# Patient Record
Sex: Female | Born: 1980 | Race: White | Hispanic: No | Marital: Married | Smoking: Former smoker
Health system: Southern US, Community
[De-identification: ages and names within clinical notes are randomized; demographics above are authoritative.]

## PROBLEM LIST (undated history)

## (undated) DIAGNOSIS — T8859XA Other complications of anesthesia, initial encounter: Secondary | ICD-10-CM

## (undated) DIAGNOSIS — S82892A Other fracture of left lower leg, initial encounter for closed fracture: Secondary | ICD-10-CM

## (undated) DIAGNOSIS — L503 Dermatographic urticaria: Secondary | ICD-10-CM

## (undated) DIAGNOSIS — E785 Hyperlipidemia, unspecified: Secondary | ICD-10-CM

## (undated) DIAGNOSIS — R112 Nausea with vomiting, unspecified: Secondary | ICD-10-CM

## (undated) DIAGNOSIS — I10 Essential (primary) hypertension: Secondary | ICD-10-CM

## (undated) DIAGNOSIS — T4145XA Adverse effect of unspecified anesthetic, initial encounter: Secondary | ICD-10-CM

## (undated) DIAGNOSIS — Z9889 Other specified postprocedural states: Secondary | ICD-10-CM

## (undated) HISTORY — PX: CERVICAL CONE BIOPSY: SUR198

## (undated) HISTORY — DX: Dermatographic urticaria: L50.3

## (undated) HISTORY — PX: WISDOM TOOTH EXTRACTION: SHX21

---

## 2013-11-28 ENCOUNTER — Ambulatory Visit (INDEPENDENT_AMBULATORY_CARE_PROVIDER_SITE_OTHER): Payer: BC Managed Care – PPO | Admitting: General Practice

## 2013-11-28 ENCOUNTER — Encounter: Payer: Self-pay | Admitting: General Practice

## 2013-11-28 ENCOUNTER — Encounter (INDEPENDENT_AMBULATORY_CARE_PROVIDER_SITE_OTHER): Payer: Self-pay

## 2013-11-28 ENCOUNTER — Telehealth: Payer: Self-pay | Admitting: Nurse Practitioner

## 2013-11-28 VITALS — BP 148/96 | HR 115 | Temp 101.7°F | Wt 218.0 lb

## 2013-11-28 DIAGNOSIS — N390 Urinary tract infection, site not specified: Secondary | ICD-10-CM

## 2013-11-28 DIAGNOSIS — J029 Acute pharyngitis, unspecified: Secondary | ICD-10-CM

## 2013-11-28 DIAGNOSIS — R509 Fever, unspecified: Secondary | ICD-10-CM

## 2013-11-28 LAB — POCT URINALYSIS DIPSTICK
Bilirubin, UA: NEGATIVE
Glucose, UA: NEGATIVE
Ketones, UA: NEGATIVE
Nitrite, UA: NEGATIVE
pH, UA: 6

## 2013-11-28 LAB — POCT INFLUENZA A/B: Influenza B, POC: NEGATIVE

## 2013-11-28 LAB — POCT RAPID STREP A (OFFICE): Rapid Strep A Screen: NEGATIVE

## 2013-11-28 MED ORDER — CEPHALEXIN 500 MG PO CAPS: 500.0000 mg | ORAL_CAPSULE | Freq: Two times a day (BID) | ORAL | Status: DC

## 2013-11-28 NOTE — Telephone Encounter (Signed)
appt today at 4:10 with Kylie Cook

## 2013-11-28 NOTE — Patient Instructions (Addendum)
Fever, Adult °A fever is a higher than normal body temperature. In an adult, an oral temperature around 98.6° F (37° C) is considered normal. A temperature of 100.4° F (38° C) or higher is generally considered a fever. Mild or moderate fevers generally have no long-term effects and often do not require treatment. Extreme fever (greater than or equal to 106° F or 41.1° C) can cause seizures. The sweating that may occur with repeated or prolonged fever may cause dehydration. Elderly people can develop confusion during a fever. °A measured temperature can vary with: °· Age. °· Time of day. °· Method of measurement (mouth, underarm, rectal, or ear). °The fever is confirmed by taking a temperature with a thermometer. Temperatures can be taken different ways. Some methods are accurate and some are not. °· An oral temperature is used most commonly. Electronic thermometers are fast and accurate. °· An ear temperature will only be accurate if the thermometer is positioned as recommended by the manufacturer. °· A rectal temperature is accurate and done for those adults who have a condition where an oral temperature cannot be taken. °· An underarm (axillary) temperature is not accurate and not recommended. °Fever is a symptom, not a disease.  °CAUSES  °· Infections commonly cause fever. °· Some noninfectious causes for fever include: °· Some arthritis conditions. °· Some thyroid or adrenal gland conditions. °· Some immune system conditions. °· Some types of cancer. °· A medicine reaction. °· High doses of certain street drugs such as methamphetamine. °· Dehydration. °· Exposure to high outside or room temperatures. °· Occasionally, the source of a fever cannot be determined. This is sometimes called a "fever of unknown origin" (FUO). °· Some situations may lead to a temporary rise in body temperature that may go away on its own. Examples are: °· Childbirth. °· Surgery. °· Intense exercise. °HOME CARE INSTRUCTIONS  °· Take  appropriate medicines for fever. Follow dosing instructions carefully. If you use acetaminophen to reduce the fever, be careful to avoid taking other medicines that also contain acetaminophen. Do not take aspirin for a fever if you are younger than age 19. There is an association with Reye's syndrome. Reye's syndrome is a rare but potentially deadly disease. °· If an infection is present and antibiotics have been prescribed, take them as directed. Finish them even if you start to feel better. °· Rest as needed. °· Maintain an adequate fluid intake. To prevent dehydration during an illness with prolonged or recurrent fever, you may need to drink extra fluid. Drink enough fluids to keep your urine clear or pale yellow. °· Sponging or bathing with room temperature water may help reduce body temperature. Do not use ice water or alcohol sponge baths. °· Dress comfortably, but do not over-bundle. °SEEK MEDICAL CARE IF:  °· You are unable to keep fluids down. °· You develop vomiting or diarrhea. °· You are not feeling at least partly better after 3 days. °· You develop new symptoms or problems. °SEEK IMMEDIATE MEDICAL CARE IF:  °· You have shortness of breath or trouble breathing. °· You develop excessive weakness. °· You are dizzy or you faint. °· You are extremely thirsty or you are making little or no urine. °· You develop new pain that was not there before (such as in the head, neck, chest, back, or abdomen). °· You have persistant vomiting and diarrhea for more than 1 to 2 days. °· You develop a stiff neck or your eyes become sensitive to light. °· You develop a   skin rash. °· You have a fever or persistent symptoms for more than 2 to 3 days. °· You have a fever and your symptoms suddenly get worse. °MAKE SURE YOU:  °· Understand these instructions. °· Will watch your condition. °· Will get help right away if you are not doing well or get worse. °Document Released: 05/13/2001 Document Revised: 02/09/2012 Document  Reviewed: 09/18/2011 °ExitCare® Patient Information ©2014 [employer]. ° °Urinary Tract Infection °Urinary tract infections (UTIs) can develop anywhere along your urinary tract. Your urinary tract is your body's drainage system for removing wastes and extra water. Your urinary tract includes two kidneys, two ureters, a bladder, and a urethra. Your kidneys are a pair of bean-shaped organs. Each kidney is about the size of your fist. They are located below your ribs, one on each side of your spine. °CAUSES °Infections are caused by microbes, which are microscopic organisms, including fungi, viruses, and bacteria. These organisms are so small that they can only be seen through a microscope. Bacteria are the microbes that most commonly cause UTIs. °SYMPTOMS  °Symptoms of UTIs may vary by age and gender of the patient and by the location of the infection. Symptoms in young women typically include a frequent and intense urge to urinate and a painful, burning feeling in the bladder or urethra during urination. Older women and men are more likely to be tired, shaky, and weak and have muscle aches and abdominal pain. A fever may mean the infection is in your kidneys. Other symptoms of a kidney infection include pain in your back or sides below the ribs, nausea, and vomiting. °DIAGNOSIS °To diagnose a UTI, your caregiver will ask you about your symptoms. Your caregiver also will ask to provide a urine sample. The urine sample will be tested for bacteria and white blood cells. White blood cells are made by your body to help fight infection. °TREATMENT  °Typically, UTIs can be treated with medication. Because most UTIs are caused by a bacterial infection, they usually can be treated with the use of antibiotics. The choice of antibiotic and length of treatment depend on your symptoms and the type of bacteria causing your infection. °HOME CARE INSTRUCTIONS °· If you were prescribed antibiotics, take them exactly as your  caregiver instructs you. Finish the medication even if you feel better after you have only taken some of the medication. °· Drink enough water and fluids to keep your urine clear or pale yellow. °· Avoid caffeine, tea, and carbonated beverages. They tend to irritate your bladder. °· Empty your bladder often. Avoid holding urine for long periods of time. °· Empty your bladder before and after sexual intercourse. °· After a bowel movement, women should cleanse from front to back. Use each tissue only once. °SEEK MEDICAL CARE IF:  °· You have back pain. °· You develop a fever. °· Your symptoms do not begin to resolve within 3 days. °SEEK IMMEDIATE MEDICAL CARE IF:  °· You have severe back pain or lower abdominal pain. °· You develop chills. °· You have nausea or vomiting. °· You have continued burning or discomfort with urination. °MAKE SURE YOU:  °· Understand these instructions. °· Will watch your condition. °· Will get help right away if you are not doing well or get worse. °Document Released: 08/27/2005 Document Revised: 05/18/2012 Document Reviewed: 12/26/2011 °ExitCare® Patient Information ©2014 [employer]. ° °

## 2013-11-29 LAB — CBC WITH DIFFERENTIAL
Basophils Absolute: 0 10*3/uL (ref 0.0–0.2)
Basos: 1 %
Eos: 1 %
HCT: 42 % (ref 34.0–46.6)
Lymphocytes Absolute: 1.7 10*3/uL (ref 0.7–3.1)
Lymphs: 41 %
MCH: 29.3 pg (ref 26.6–33.0)
MCV: 85 fL (ref 79–97)
Monocytes: 13 %
Neutrophils Absolute: 1.8 10*3/uL (ref 1.4–7.0)
Platelets: 311 10*3/uL (ref 150–379)
RBC: 4.92 x10E6/uL (ref 3.77–5.28)
WBC: 4.2 10*3/uL (ref 3.4–10.8)

## 2013-11-29 LAB — MONONUCLEOSIS SCREEN: Mono Screen: NEGATIVE

## 2013-11-30 LAB — URINE CULTURE

## 2013-11-30 NOTE — Progress Notes (Signed)
Subjective:    Patient ID: Kylie Cook, female    DOB: 03/03/1981, 32 y.o.   MRN: [id number]  Fever  This is a new problem. The current episode started in the past 7 days. The problem occurs daily. The problem has been unchanged. The maximum temperature noted was 100 to 100.9 F. The temperature was taken using an oral thermometer. Associated symptoms include coughing, a rash and a sore throat. Pertinent negatives include no abdominal pain, chest pain, congestion, diarrhea or headaches. She has tried acetaminophen for the symptoms.  Sore Throat  Associated symptoms include coughing. Pertinent negatives include no abdominal pain, congestion, diarrhea, headaches or shortness of breath.  Cough Associated symptoms include a fever, a rash and a sore throat. Pertinent negatives include no chest pain, headaches, postnasal drip, rhinorrhea or shortness of breath.  Rash Associated symptoms include coughing, a fever and a sore throat. Pertinent negatives include no congestion, diarrhea, rhinorrhea or shortness of breath.      Review of Systems  Constitutional: Positive for fever.  HENT: Positive for sore throat. Negative for congestion, postnasal drip, rhinorrhea and sinus pressure.   Respiratory: Positive for cough. Negative for chest tightness and shortness of breath.   Cardiovascular: Negative for chest pain and palpitations.  Gastrointestinal: Negative for abdominal pain, diarrhea and abdominal distention.  Genitourinary: Negative for difficulty urinating.  Skin: Positive for rash.  Neurological: Negative for dizziness, weakness and headaches.       Objective:   Physical Exam  Constitutional: She is oriented to person, place, and time. She appears well-developed and well-nourished.  HENT:  Head: Normocephalic and atraumatic.  Right Ear: External ear normal.  Left Ear: External ear normal.  Mouth/Throat: Oropharynx is clear and moist.  Cardiovascular: Normal heart sounds.  Tachycardia  present.   Pulmonary/Chest: Effort normal and breath sounds normal. No respiratory distress. She exhibits no tenderness.  Neurological: She is alert and oriented to person, place, and time.  Skin: Skin is warm and dry.  Psychiatric: She has a normal mood and affect.   Results for orders placed in visit on 11/28/13  MONONUCLEOSIS SCREEN      Result Value Range   Mono Screen Negative  Negative  CBC WITH DIFFERENTIAL/PLATELET      Result Value Range   WBC 4.2  3.4 - 10.8 x10E3/uL   RBC 4.92  3.77 - 5.28 x10E6/uL   Hemoglobin 14.4  11.1 - 15.9 g/dL   HCT 40.9  81.1 - 91.4 %   MCV 85  79 - 97 fL   MCH 29.3  26.6 - 33.0 pg   MCHC 34.3  31.5 - 35.7 g/dL   RDW 78.2  95.6 - 21.3 %   Platelets 311  150 - 379 x10E3/uL   Neutrophils Relative % 44     Lymphs 41     Monocytes 13     Eos 1     Basos 1     Neutrophils Absolute 1.8  1.4 - 7.0 x10E3/uL   Lymphocytes Absolute 1.7  0.7 - 3.1 x10E3/uL   Monocytes Absolute 0.6  0.1 - 0.9 x10E3/uL   Eosinophils Absolute 0.1  0.0 - 0.4 x10E3/uL   Basophils Absolute 0.0  0.0 - 0.2 x10E3/uL   Immature Granulocytes 0     Immature Grans (Abs) 0.0  0.0 - 0.1 x10E3/uL  POCT RAPID STREP A (OFFICE)      Result Value Range   Rapid Strep A Screen Negative  Negative  POCT INFLUENZA A/B  Result Value Range   Influenza A, POC Negative     Influenza B, POC Negative    POCT URINALYSIS DIPSTICK      Result Value Range   Color, UA yellow     Clarity, UA clear     Glucose, UA neg     Bilirubin, UA neg     Ketones, UA neg     Spec Grav, UA 1.010     Blood, UA large     pH, UA 6.0     Protein, UA neg     Urobilinogen, UA negative     Nitrite, UA neg     Leukocytes, UA large (3+)            Assessment & Plan:  1. Sore throat  - POCT rapid strep A - POCT Influenza A/B - Mononucleosis screen  2. Fever  - CBC with Differential - POCT urinalysis dipstick - Mononucleosis screen  3. Urinary tract infection, site not specified  - Urine  culture - cephALEXin (KEFLEX) 500 MG capsule; Take 1 capsule (500 mg total) by mouth 2 (two) times daily.  Dispense: 20 capsule; Refill: 0 -Increase fluid intake Frequent voiding Proper perineal hygiene RTO prn Patient verbalized understanding Coralie Keens, FNP-C

## 2013-12-02 ENCOUNTER — Other Ambulatory Visit: Payer: Self-pay | Admitting: General Practice

## 2016-10-21 ENCOUNTER — Encounter: Payer: Self-pay | Admitting: Pediatrics

## 2016-10-21 ENCOUNTER — Ambulatory Visit (INDEPENDENT_AMBULATORY_CARE_PROVIDER_SITE_OTHER): Payer: BC Managed Care – PPO | Admitting: Pediatrics

## 2016-10-21 VITALS — BP 134/80 | HR 71 | Temp 98.5°F | Ht 67.5 in | Wt 213.4 lb

## 2016-10-21 DIAGNOSIS — J019 Acute sinusitis, unspecified: Secondary | ICD-10-CM | POA: Diagnosis not present

## 2016-10-21 DIAGNOSIS — J069 Acute upper respiratory infection, unspecified: Secondary | ICD-10-CM | POA: Diagnosis not present

## 2016-10-21 MED ORDER — AMOXICILLIN-POT CLAVULANATE 875-125 MG PO TABS: 1.0000 | ORAL_TABLET | Freq: Two times a day (BID) | ORAL | 0 refills | Status: DC

## 2016-10-21 NOTE — Patient Instructions (Signed)
Netipot with distilled water 2-3 times a day to clear out sinuses Flonase steroid nasal spray Ibuprofen 600mg  three times a day Lots of fluids  Start antibiotic if you start to get worse, have fevers, sinus pressure is worsening, not improving in next 2-3 days.

## 2016-10-21 NOTE — Progress Notes (Signed)
  Subjective:   Patient ID: Kylie Cook, female    DOB: 12/04/1980, 35 y.o.   MRN: [id number] CC: Sinus Pressure and Nasal Congestion  HPI: Kylie Cook is a 35 y.o. female presenting for Sinus Pressure and Nasal Congestion  Has had sinus pressure for the past few days Sometimes will get better but then pain returns Having headaches in temple  No fevers Appetite down slightly Toddler with lots of URIs  Some R ear pain Taking some motrin  Relevant past medical, surgical, family and social history reviewed. Allergies and medications reviewed and updated. History  Smoking Status  . Former Smoker  . Quit date: 12/01/2002  Smokeless Tobacco  . Never Used   ROS: Per HPI   Objective:    BP 134/80   Pulse 71   Temp 98.5 F (36.9 C) (Oral)   Ht 5' 7.5" (1.715 m)   Wt 213 lb 6.4 oz (96.8 kg)   BMI 32.93 kg/m   Wt Readings from Last 3 Encounters:  10/21/16 213 lb 6.4 oz (96.8 kg)  11/28/13 218 lb (98.9 kg)    Gen: NAD, alert, cooperative with exam, NCAT EYES: EOMI, no conjunctival injection, or no icterus ENT:  TMs dull gray b/l, OP without erythema, TTP b/l frontal sinuses LYMPH: no cervical LAD CV: NRRR, normal S1/S2, no murmur, distal pulses 2+ b/l Resp: CTABL, no wheezes, normal WOB Neuro: Alert and oriented MSK: normal muscle bulk  Assessment & Plan:  Kylie Cook was seen today for sinus pressure and nasal congestion.  Diagnoses and all orders for this visit:  Acute sinusitis, recurrence not specified, unspecified location Acute URI Netipot with distilled water 2-3 times a day to clear out sinuses Flonase steroid nasal spray Ibuprofen 600mg  three times a day Lots of fluids  Start antibiotic if you start to get worse, have fevers, sinus pressure is worsening, not improving in next 2-3 days. -     amoxicillin-clavulanate (AUGMENTIN) 875-125 MG tablet; Take 1 tablet by mouth 2 (two) times daily.  Follow up plan: As needed Rex Krasarol Plumer Mittelstaedt, MD [facility]

## 2019-01-14 ENCOUNTER — Ambulatory Visit: Payer: BC Managed Care – PPO | Admitting: Family Medicine

## 2019-01-14 ENCOUNTER — Encounter: Payer: Self-pay | Admitting: Family Medicine

## 2019-01-14 VITALS — BP 127/81 | HR 61 | Temp 98.1°F | Ht 67.5 in | Wt 233.0 lb

## 2019-01-14 DIAGNOSIS — J029 Acute pharyngitis, unspecified: Secondary | ICD-10-CM

## 2019-01-14 DIAGNOSIS — B9689 Other specified bacterial agents as the cause of diseases classified elsewhere: Secondary | ICD-10-CM

## 2019-01-14 DIAGNOSIS — J019 Acute sinusitis, unspecified: Secondary | ICD-10-CM

## 2019-01-14 LAB — CULTURE, GROUP A STREP

## 2019-01-14 LAB — RAPID STREP SCREEN (MED CTR MEBANE ONLY): STREP GP A AG, IA W/REFLEX: NEGATIVE

## 2019-01-14 MED ORDER — CETIRIZINE HCL 10 MG PO TABS: 10.0000 mg | ORAL_TABLET | Freq: Every day | ORAL | 11 refills | Status: DC

## 2019-01-14 MED ORDER — FLUTICASONE PROPIONATE 50 MCG/ACT NA SUSP: 2.0000 | Freq: Every day | NASAL | 6 refills | Status: DC

## 2019-01-14 MED ORDER — AMOXICILLIN-POT CLAVULANATE 875-125 MG PO TABS: 1.0000 | ORAL_TABLET | Freq: Two times a day (BID) | ORAL | 0 refills | Status: DC

## 2019-01-14 NOTE — Patient Instructions (Signed)
Sore Throat  When you have a sore throat, your throat may feel:  · Tender.  · Burning.  · Irritated.  · Scratchy.  · Painful when you swallow.  · Painful when you talk.  Many things can cause a sore throat, such as:  · An infection.  · Allergies.  · Dry air.  · Smoke or pollution.  · Radiation treatment.  · Gastroesophageal reflux disease (GERD).  · A tumor.  A sore throat can be the first sign of another sickness. It can happen with other problems, like:  · Coughing.  · Sneezing.  · Fever.  · Swelling in the neck.  Most sore throats go away without treatment.  Follow these instructions at home:         · Take over-the-counter medicines only as told by your doctor.  ? If your child has a sore throat, do not give your child aspirin.  · Drink enough fluids to keep your pee (urine) pale yellow.  · Rest when you feel you need to.  · To help with pain:  ? Sip warm liquids, such as broth, herbal tea, or warm water.  ? Eat or drink cold or frozen liquids, such as frozen ice pops.  ? Gargle with a salt-water mixture 3-4 times a day or as needed. To make a salt-water mixture, add ½-1 tsp (3-6 g) of salt to 1 cup (237 mL) of warm water. Mix it until you cannot see the salt anymore.  ? Suck on hard candy or throat lozenges.  ? Put a cool-mist humidifier in your bedroom at night.  ? Sit in the bathroom with the door closed for 5-10 minutes while you run hot water in the shower.  · Do not use any products that contain nicotine or tobacco, such as cigarettes, e-cigarettes, and chewing tobacco. If you need help quitting, ask your doctor.  · Wash your hands well and often with soap and water. If soap and water are not available, use hand sanitizer.  Contact a doctor if:  · You have a fever for more than 2-3 days.  · You keep having symptoms for more than 2-3 days.  · Your throat does not get better in 7 days.  · You have a fever and your symptoms suddenly get worse.  · Your child who is 3 months to 3 years old has a temperature of  102.2°F (39°C) or higher.  Get help right away if:  · You have trouble breathing.  · You cannot swallow fluids, soft foods, or your saliva.  · You have swelling in your throat or neck that gets worse.  · You keep feeling sick to your stomach (nauseous).  · You keep throwing up (vomiting).  Summary  · A sore throat is pain, burning, irritation, or scratchiness in the throat. Many things can cause a sore throat.  · Take over-the-counter medicines only as told by your doctor. Do not give your child aspirin.  · Drink plenty of fluids, and rest as needed.  · Contact a doctor if your symptoms get worse or your sore throat does not get better within 7 days.  This information is not intended to replace advice given to you by your health care provider. Make sure you discuss any questions you have with your health care provider.  Document Released: 08/26/2008 Document Revised: 04/19/2018 Document Reviewed: 04/19/2018  Elsevier Interactive Patient Education © 2019 [employer] Inc[employer]

## 2019-01-14 NOTE — Progress Notes (Signed)
Kylie Cook is a 38 y.o. female presenting with a sore throat and sinus pressure for 14 days.  Associated symptoms include:  headache and nasal/sinus congestion.  Symptoms are progressively worsening.  Home treatment thus far includes:  NSAIDS/acetaminophen and lozenges. Minimal to no relief of symptoms.   No known sick contacts with similar symptoms.  There is no history of of similar symptoms.  Review of Systems  Constitutional: Positive for chills and malaise/fatigue. Negative for fever.  HENT: Positive for congestion, sinus pain and sore throat.   Respiratory: Positive for cough. Negative for shortness of breath.   Cardiovascular: Negative for chest pain and palpitations.  Gastrointestinal: Negative for abdominal pain, nausea and vomiting.  Musculoskeletal: Negative for myalgias.  Neurological: Positive for headaches. Negative for dizziness and weakness.  All other systems reviewed and are negative.    Exam:  BP 127/81   Pulse 61   Temp 98.1 F (36.7 C) (Oral)   Ht 5' 7.5" (1.715 m)   Wt 233 lb (105.7 kg)   BMI 35.95 kg/m  Physical Exam Vitals signs and nursing note reviewed.  Constitutional:      General: She is not in acute distress.    Appearance: She is well-developed. She is not ill-appearing or toxic-appearing.  HENT:     Head: Normocephalic and atraumatic.     Right Ear: Ear canal and external ear normal. A middle ear effusion is present. Tympanic membrane is not erythematous.     Left Ear: Ear canal and external ear normal. A middle ear effusion is present. Tympanic membrane is not erythematous.     Nose: Congestion and rhinorrhea present.     Right Sinus: Maxillary sinus tenderness and frontal sinus tenderness present.     Left Sinus: Maxillary sinus tenderness and frontal sinus tenderness present.     Mouth/Throat:     Mouth: Mucous membranes are moist. No oral lesions.     Pharynx: Pharyngeal swelling and posterior oropharyngeal erythema present. No  oropharyngeal exudate or uvula swelling.     Tonsils: No tonsillar exudate or tonsillar abscesses. Swelling: 2+ on the right. 2+ on the left.  Eyes:     Conjunctiva/sclera: Conjunctivae normal.     Pupils: Pupils are equal, round, and reactive to light.  Neck:     Musculoskeletal: Normal range of motion and neck supple.  Cardiovascular:     Rate and Rhythm: Normal rate and regular rhythm.     Heart sounds: Normal heart sounds. No murmur. No friction rub. No gallop.   Pulmonary:     Effort: Pulmonary effort is normal. No respiratory distress.     Breath sounds: Normal breath sounds.  Lymphadenopathy:     Cervical: No cervical adenopathy.  Skin:    General: Skin is warm and dry.     Capillary Refill: Capillary refill takes less than 2 seconds.  Neurological:     General: No focal deficit present.     Mental Status: She is alert and oriented to person, place, and time.  Psychiatric:        Mood and Affect: Mood normal.        Behavior: Behavior normal.        Thought Content: Thought content normal.        Judgment: Judgment normal.    Rapid strep negative in office today.   Assessment and Plan  [name] was seen today for sore throat.  Diagnoses and all orders for this visit:  Acute bacterial rhinosinusitis Symptomatic care  discussed. Due to ongoing symptoms for 2 weeks, will treat with Augmentin. Medications as prescribed. Report any new or worsening symptoms.  -     amoxicillin-clavulanate (AUGMENTIN) 875-125 MG tablet; Take 1 tablet by mouth 2 (two) times daily for 7 days. -     fluticasone (FLONASE) 50 MCG/ACT nasal spray; Place 2 sprays into both nostrils daily. -     cetirizine (ZYRTEC) 10 MG tablet; Take 1 tablet (10 mg total) by mouth daily.  Sore throat -     Rapid Strep Screen ([facility] ONLY) -     Culture, Group A Strep   Return if symptoms worsen or fail to improve.  The above assessment and management plan was discussed with the patient. The patient  verbalized understanding of and has agreed to the management plan. Patient is aware to call the clinic if symptoms fail to improve or worsen. Patient is aware when to return to the clinic for a follow-up visit. Patient educated on when it is appropriate to go to the emergency department.   Kari Baars, FNP-C [facility] [address] [place], [place] [address] [phone]

## 2019-01-17 LAB — CULTURE, GROUP A STREP

## 2019-01-20 ENCOUNTER — Telehealth: Payer: Self-pay | Admitting: Family Medicine

## 2019-01-20 ENCOUNTER — Other Ambulatory Visit: Payer: Self-pay | Admitting: Family Medicine

## 2019-01-20 DIAGNOSIS — B9689 Other specified bacterial agents as the cause of diseases classified elsewhere: Secondary | ICD-10-CM

## 2019-01-20 DIAGNOSIS — J019 Acute sinusitis, unspecified: Principal | ICD-10-CM

## 2019-01-20 MED ORDER — DOXYCYCLINE HYCLATE 100 MG PO TABS: 100.0000 mg | ORAL_TABLET | Freq: Two times a day (BID) | ORAL | 0 refills | Status: AC

## 2019-01-20 NOTE — Telephone Encounter (Signed)
Pt was seen last week for sore throat she is on the last day of her abx. Does she need to be seen its been an on going problem. See if they need to see pcp. Please advise. Pt uses [facility] in [place].

## 2019-01-20 NOTE — Telephone Encounter (Signed)
Patient aware.

## 2019-01-20 NOTE — Telephone Encounter (Signed)
I will send in additional antibiotic. If she continues to have symptoms after 4 days of this antibiotic, I would like her to be seen.

## 2019-03-14 ENCOUNTER — Other Ambulatory Visit (HOSPITAL_COMMUNITY): Payer: Self-pay | Admitting: Orthopedic Surgery

## 2019-03-14 ENCOUNTER — Encounter (HOSPITAL_BASED_OUTPATIENT_CLINIC_OR_DEPARTMENT_OTHER): Payer: Self-pay | Admitting: *Deleted

## 2019-03-14 ENCOUNTER — Other Ambulatory Visit: Payer: Self-pay

## 2019-03-14 DIAGNOSIS — S82852A Displaced trimalleolar fracture of left lower leg, initial encounter for closed fracture: Secondary | ICD-10-CM | POA: Insufficient documentation

## 2019-03-15 ENCOUNTER — Other Ambulatory Visit: Payer: Self-pay | Admitting: Student

## 2019-03-15 DIAGNOSIS — M25572 Pain in left ankle and joints of left foot: Secondary | ICD-10-CM

## 2019-03-16 ENCOUNTER — Ambulatory Visit
Admission: RE | Admit: 2019-03-16 | Discharge: 2019-03-16 | Disposition: A | Discharge status: Home / Self Care | Payer: BC Managed Care – PPO | Source: Ambulatory Visit | Attending: Student | Admitting: Student

## 2019-03-16 ENCOUNTER — Other Ambulatory Visit: Payer: Self-pay

## 2019-03-16 DIAGNOSIS — M25572 Pain in left ankle and joints of left foot: Secondary | ICD-10-CM

## 2019-03-21 ENCOUNTER — Other Ambulatory Visit: Payer: Self-pay

## 2019-03-21 NOTE — Anesthesia Preprocedure Evaluation (Addendum)
Anesthesia Evaluation  Patient identified by MRN, date of birth, ID band Patient awake    Reviewed: Allergy & Precautions, NPO status , Patient's Chart, lab work & pertinent test results  History of Anesthesia Complications (+) PONV and history of anesthetic complications  Airway Mallampati: II  TM Distance: >3 FB Neck ROM: Full    Dental  (+) Dental Advisory Given, Teeth Intact   Pulmonary former smoker,    breath sounds clear to auscultation       Cardiovascular negative cardio ROS   Rhythm:Regular Rate:Tachycardia     Neuro/Psych negative neurological ROS  negative psych ROS   GI/Hepatic negative GI ROS, Neg liver ROS,   Endo/Other   Obesity   Renal/GU negative Renal ROS     Musculoskeletal negative musculoskeletal ROS (+)   Abdominal   Peds  Hematology negative hematology ROS (+)   Anesthesia Other Findings   Reproductive/Obstetrics                            Anesthesia Physical Anesthesia Plan  ASA: II  Anesthesia Plan: General   Post-op Pain Management:  Regional for Post-op pain   Induction: Intravenous  PONV Risk Score and Plan: 4 or greater and Treatment may vary due to age or medical condition, Ondansetron, Dexamethasone and Midazolam  Airway Management Planned: LMA  Additional Equipment: None  Intra-op Plan:   Post-operative Plan: Extubation in OR  Informed Consent: I have reviewed the patients History and Physical, chart, labs and discussed the procedure including the risks, benefits and alternatives for the proposed anesthesia with the patient or authorized representative who has indicated his/her understanding and acceptance.     Dental advisory given  Plan Discussed with: CRNA and Anesthesiologist  Anesthesia Plan Comments:        Anesthesia Quick Evaluation

## 2019-03-21 NOTE — Progress Notes (Signed)
Pt called and re screened for s/s of Covid-19 virus. Denies: SOB, fever, chills, head or body ache's, runny nose, sneezing or sore throat. Denies any travel out of the state in the last 3 weeks. States that no one in the home has any of the above symptoms. Instructions and arrival time reviewed. Reviewed with Dr. Okey Dupre as pt did have at temp of 100.2 on 4/16 (orig surg date, postponed because of this) and 4/17.  Pt has not had Covid test, she states that she was told she did not meet criteria.  Ok to come for surgery in AM. I instructed pt to call [phone] and Dr Victorino Dike if any change in how she feels or if she develops another low grade temp.

## 2019-03-22 ENCOUNTER — Ambulatory Visit (HOSPITAL_BASED_OUTPATIENT_CLINIC_OR_DEPARTMENT_OTHER)
Admission: RE | Admit: 2019-03-22 | Discharge: 2019-03-22 | Disposition: A | Discharge status: Home / Self Care | Payer: BC Managed Care – PPO | Attending: Orthopedic Surgery | Admitting: Orthopedic Surgery

## 2019-03-22 ENCOUNTER — Ambulatory Visit (HOSPITAL_BASED_OUTPATIENT_CLINIC_OR_DEPARTMENT_OTHER): Payer: BC Managed Care – PPO | Admitting: Anesthesiology

## 2019-03-22 ENCOUNTER — Encounter (HOSPITAL_BASED_OUTPATIENT_CLINIC_OR_DEPARTMENT_OTHER)
Admission: RE | Disposition: A | Discharge status: Home / Self Care | Payer: Self-pay | Source: Home / Self Care | Attending: Orthopedic Surgery

## 2019-03-22 ENCOUNTER — Other Ambulatory Visit: Payer: Self-pay

## 2019-03-22 ENCOUNTER — Encounter (HOSPITAL_BASED_OUTPATIENT_CLINIC_OR_DEPARTMENT_OTHER): Payer: Self-pay | Admitting: Certified Registered"

## 2019-03-22 DIAGNOSIS — Z791 Long term (current) use of non-steroidal anti-inflammatories (NSAID): Secondary | ICD-10-CM | POA: Diagnosis not present

## 2019-03-22 DIAGNOSIS — Z6834 Body mass index (BMI) 34.0-34.9, adult: Secondary | ICD-10-CM | POA: Diagnosis not present

## 2019-03-22 DIAGNOSIS — Z79899 Other long term (current) drug therapy: Secondary | ICD-10-CM | POA: Insufficient documentation

## 2019-03-22 DIAGNOSIS — Z833 Family history of diabetes mellitus: Secondary | ICD-10-CM | POA: Insufficient documentation

## 2019-03-22 DIAGNOSIS — Z87891 Personal history of nicotine dependence: Secondary | ICD-10-CM | POA: Insufficient documentation

## 2019-03-22 DIAGNOSIS — S93432A Sprain of tibiofibular ligament of left ankle, initial encounter: Secondary | ICD-10-CM | POA: Diagnosis not present

## 2019-03-22 DIAGNOSIS — E669 Obesity, unspecified: Secondary | ICD-10-CM | POA: Insufficient documentation

## 2019-03-22 DIAGNOSIS — R000 Tachycardia, unspecified: Secondary | ICD-10-CM | POA: Insufficient documentation

## 2019-03-22 DIAGNOSIS — S82852A Displaced trimalleolar fracture of left lower leg, initial encounter for closed fracture: Secondary | ICD-10-CM | POA: Diagnosis not present

## 2019-03-22 DIAGNOSIS — Y9301 Activity, walking, marching and hiking: Secondary | ICD-10-CM | POA: Diagnosis not present

## 2019-03-22 DIAGNOSIS — W19XXXA Unspecified fall, initial encounter: Secondary | ICD-10-CM | POA: Insufficient documentation

## 2019-03-22 DIAGNOSIS — Z8249 Family history of ischemic heart disease and other diseases of the circulatory system: Secondary | ICD-10-CM | POA: Insufficient documentation

## 2019-03-22 DIAGNOSIS — Z882 Allergy status to sulfonamides status: Secondary | ICD-10-CM | POA: Diagnosis not present

## 2019-03-22 HISTORY — DX: Other specified postprocedural states: Z98.890

## 2019-03-22 HISTORY — PX: ORIF ANKLE FRACTURE: SHX5408

## 2019-03-22 HISTORY — DX: Adverse effect of unspecified anesthetic, initial encounter: T41.45XA

## 2019-03-22 HISTORY — DX: Other fracture of left lower leg, initial encounter for closed fracture: S82.892A

## 2019-03-22 HISTORY — DX: Other complications of anesthesia, initial encounter: T88.59XA

## 2019-03-22 HISTORY — DX: Nausea with vomiting, unspecified: R11.2

## 2019-03-22 SURGERY — OPEN REDUCTION INTERNAL FIXATION (ORIF) ANKLE FRACTURE
Anesthesia: General | Site: Ankle | Laterality: Left

## 2019-03-22 MED ORDER — ASPIRIN EC 81 MG PO TBEC: 81.0000 mg | DELAYED_RELEASE_TABLET | Freq: Two times a day (BID) | ORAL | 0 refills | Status: DC

## 2019-03-22 MED ORDER — FENTANYL CITRATE (PF) 100 MCG/2ML IJ SOLN
INTRAMUSCULAR | Status: AC
  Filled 2019-03-22: qty 2

## 2019-03-22 MED ORDER — OXYCODONE HCL 5 MG/5ML PO SOLN: 5.0000 mg | Freq: Once | ORAL | Status: DC | PRN

## 2019-03-22 MED ORDER — ONDANSETRON HCL 4 MG/2ML IJ SOLN
INTRAMUSCULAR | Status: AC
  Filled 2019-03-22: qty 8

## 2019-03-22 MED ORDER — FENTANYL CITRATE (PF) 100 MCG/2ML IJ SOLN: 25.0000 ug | INTRAMUSCULAR | Status: DC | PRN

## 2019-03-22 MED ORDER — OXYCODONE HCL 5 MG PO TABS: 5.0000 mg | ORAL_TABLET | Freq: Once | ORAL | Status: DC | PRN

## 2019-03-22 MED ORDER — LIDOCAINE 2% (20 MG/ML) 5 ML SYRINGE
INTRAMUSCULAR | Status: AC
  Filled 2019-03-22: qty 10

## 2019-03-22 MED ORDER — DEXAMETHASONE SODIUM PHOSPHATE 10 MG/ML IJ SOLN
INTRAMUSCULAR | Status: AC
  Filled 2019-03-22: qty 2

## 2019-03-22 MED ORDER — PROPOFOL 500 MG/50ML IV EMUL
INTRAVENOUS | Status: DC | PRN
  Administered 2019-03-22: 25 ug/kg/min via INTRAVENOUS

## 2019-03-22 MED ORDER — CEFAZOLIN SODIUM-DEXTROSE 2-4 GM/100ML-% IV SOLN
2.0000 g | INTRAVENOUS | Status: AC
  Administered 2019-03-22: 2 g via INTRAVENOUS

## 2019-03-22 MED ORDER — ONDANSETRON 4 MG PO TBDP
ORAL_TABLET | ORAL | Status: AC
  Filled 2019-03-22: qty 1

## 2019-03-22 MED ORDER — PROPOFOL 10 MG/ML IV BOLUS
INTRAVENOUS | Status: DC | PRN
  Administered 2019-03-22: 200 mg via INTRAVENOUS

## 2019-03-22 MED ORDER — MIDAZOLAM HCL 2 MG/2ML IJ SOLN
1.0000 mg | INTRAMUSCULAR | Status: DC | PRN
  Administered 2019-03-22: 07:00:00 2 mg via INTRAVENOUS

## 2019-03-22 MED ORDER — SODIUM CHLORIDE 0.9 % IV SOLN: INTRAVENOUS | Status: DC

## 2019-03-22 MED ORDER — SCOPOLAMINE 1 MG/3DAYS TD PT72: 1.0000 | MEDICATED_PATCH | Freq: Once | TRANSDERMAL | Status: DC | PRN

## 2019-03-22 MED ORDER — BUPIVACAINE-EPINEPHRINE (PF) 0.5% -1:200000 IJ SOLN
INTRAMUSCULAR | Status: DC | PRN
  Administered 2019-03-22: 15 mL via PERINEURAL
  Administered 2019-03-22: 25 mL via PERINEURAL

## 2019-03-22 MED ORDER — MIDAZOLAM HCL 2 MG/2ML IJ SOLN
INTRAMUSCULAR | Status: AC
  Filled 2019-03-22: qty 2

## 2019-03-22 MED ORDER — OXYCODONE HCL 5 MG PO TABS: 5.0000 mg | ORAL_TABLET | ORAL | 0 refills | Status: AC | PRN

## 2019-03-22 MED ORDER — LACTATED RINGERS IV SOLN
INTRAVENOUS | Status: DC
  Administered 2019-03-22 (×2): via INTRAVENOUS

## 2019-03-22 MED ORDER — PROPOFOL 500 MG/50ML IV EMUL
INTRAVENOUS | Status: AC
  Filled 2019-03-22: qty 100

## 2019-03-22 MED ORDER — CEFAZOLIN SODIUM-DEXTROSE 2-4 GM/100ML-% IV SOLN
INTRAVENOUS | Status: AC
  Filled 2019-03-22: qty 100

## 2019-03-22 MED ORDER — ONDANSETRON 4 MG PO TBDP
4.0000 mg | ORAL_TABLET | Freq: Once | ORAL | Status: AC
  Administered 2019-03-22: 4 mg via ORAL

## 2019-03-22 MED ORDER — DOCUSATE SODIUM 100 MG PO CAPS: 100.0000 mg | ORAL_CAPSULE | Freq: Two times a day (BID) | ORAL | 0 refills | Status: DC

## 2019-03-22 MED ORDER — LIDOCAINE HCL (CARDIAC) PF 100 MG/5ML IV SOSY
PREFILLED_SYRINGE | INTRAVENOUS | Status: DC | PRN
  Administered 2019-03-22: 30 mg via INTRAVENOUS

## 2019-03-22 MED ORDER — SENNA 8.6 MG PO TABS: 2.0000 | ORAL_TABLET | Freq: Two times a day (BID) | ORAL | 0 refills | Status: DC

## 2019-03-22 MED ORDER — 0.9 % SODIUM CHLORIDE (POUR BTL) OPTIME
TOPICAL | Status: DC | PRN
  Administered 2019-03-22: 10:00:00 1000 mL

## 2019-03-22 MED ORDER — CHLORHEXIDINE GLUCONATE 4 % EX LIQD: 60.0000 mL | Freq: Once | CUTANEOUS | Status: DC

## 2019-03-22 MED ORDER — FENTANYL CITRATE (PF) 100 MCG/2ML IJ SOLN
50.0000 ug | INTRAMUSCULAR | Status: AC | PRN
  Administered 2019-03-22: 50 ug via INTRAVENOUS
  Administered 2019-03-22: 08:00:00 25 ug via INTRAVENOUS
  Administered 2019-03-22: 50 ug via INTRAVENOUS
  Administered 2019-03-22: 09:00:00 25 ug via INTRAVENOUS

## 2019-03-22 MED ORDER — PROMETHAZINE HCL 25 MG/ML IJ SOLN: 6.2500 mg | INTRAMUSCULAR | Status: DC | PRN

## 2019-03-22 MED ORDER — DEXAMETHASONE SODIUM PHOSPHATE 10 MG/ML IJ SOLN
INTRAMUSCULAR | Status: DC | PRN
  Administered 2019-03-22: 10 mg via INTRAVENOUS

## 2019-03-22 SURGICAL SUPPLY — 91 items
BANDAGE ACE 4X5 VEL STRL LF (GAUZE/BANDAGES/DRESSINGS) ×3 IMPLANT
BANDAGE ACE 6X5 VEL STRL LF (GAUZE/BANDAGES/DRESSINGS) ×3 IMPLANT
BANDAGE ESMARK 6X9 LF (GAUZE/BANDAGES/DRESSINGS) ×1 IMPLANT
BIT DRILL 2.5X2.75 QC CALB (BIT) ×3 IMPLANT
BIT DRILL 2.9 CANN QC NONSTRL (BIT) ×3 IMPLANT
BLADE SURG 15 STRL LF DISP TIS (BLADE) ×3 IMPLANT
BLADE SURG 15 STRL SS (BLADE) ×6
BNDG COHESIVE 4X5 TAN STRL (GAUZE/BANDAGES/DRESSINGS) ×3 IMPLANT
BNDG COHESIVE 6X5 TAN STRL LF (GAUZE/BANDAGES/DRESSINGS) ×3 IMPLANT
BNDG ESMARK 4X9 LF (GAUZE/BANDAGES/DRESSINGS) IMPLANT
BNDG ESMARK 6X9 LF (GAUZE/BANDAGES/DRESSINGS) ×3
CANISTER SUCT 1200ML W/VALVE (MISCELLANEOUS) ×3 IMPLANT
CHLORAPREP W/TINT 26 (MISCELLANEOUS) ×3 IMPLANT
COVER BACK TABLE REUSABLE LG (DRAPES) ×3 IMPLANT
COVER WAND RF STERILE (DRAPES) IMPLANT
CUFF TOURN SGL QUICK 34 (TOURNIQUET CUFF) ×2
CUFF TRNQT CYL 34X4.125X (TOURNIQUET CUFF) ×1 IMPLANT
DECANTER SPIKE VIAL GLASS SM (MISCELLANEOUS) IMPLANT
DRAPE EXTREMITY T 121X128X90 (DISPOSABLE) ×3 IMPLANT
DRAPE OEC MINIVIEW 54X84 (DRAPES) ×3 IMPLANT
DRAPE U-SHAPE 47X51 STRL (DRAPES) ×3 IMPLANT
DRSG MEPITEL 4X7.2 (GAUZE/BANDAGES/DRESSINGS) ×6 IMPLANT
DRSG PAD ABDOMINAL 8X10 ST (GAUZE/BANDAGES/DRESSINGS) ×6 IMPLANT
ELECT REM PT RETURN 9FT ADLT (ELECTROSURGICAL) ×3
ELECTRODE REM PT RTRN 9FT ADLT (ELECTROSURGICAL) ×1 IMPLANT
FIXATION ZIPTIGHT ANKLE SNDSMS (Ankle) ×1 IMPLANT
GAUZE SPONGE 4X4 12PLY STRL (GAUZE/BANDAGES/DRESSINGS) ×3 IMPLANT
GLOVE BIO SURGEON STRL SZ8 (GLOVE) ×9 IMPLANT
GLOVE BIOGEL PI IND STRL 7.0 (GLOVE) ×1 IMPLANT
GLOVE BIOGEL PI IND STRL 8 (GLOVE) ×2 IMPLANT
GLOVE BIOGEL PI INDICATOR 7.0 (GLOVE) ×2
GLOVE BIOGEL PI INDICATOR 8 (GLOVE) ×4
GLOVE ECLIPSE 8.0 STRL XLNG CF (GLOVE) ×3 IMPLANT
GOWN STRL REUS W/ TWL LRG LVL3 (GOWN DISPOSABLE) ×1 IMPLANT
GOWN STRL REUS W/ TWL XL LVL3 (GOWN DISPOSABLE) ×3 IMPLANT
GOWN STRL REUS W/TWL LRG LVL3 (GOWN DISPOSABLE) ×2
GOWN STRL REUS W/TWL XL LVL3 (GOWN DISPOSABLE) ×6
K-WIRE ACE 1.6X6 (WIRE) ×9
KWIRE ACE 1.6X6 (WIRE) ×3 IMPLANT
NEEDLE HYPO 22GX1.5 SAFETY (NEEDLE) IMPLANT
NS IRRIG 1000ML POUR BTL (IV SOLUTION) ×3 IMPLANT
PACK BASIN DAY SURGERY FS (CUSTOM PROCEDURE TRAY) ×3 IMPLANT
PAD CAST 4YDX4 CTTN HI CHSV (CAST SUPPLIES) ×1 IMPLANT
PADDING CAST ABS 4INX4YD NS (CAST SUPPLIES)
PADDING CAST ABS COTTON 4X4 ST (CAST SUPPLIES) IMPLANT
PADDING CAST COTTON 4X4 STRL (CAST SUPPLIES) ×2
PADDING CAST COTTON 6X4 STRL (CAST SUPPLIES) ×3 IMPLANT
PENCIL BUTTON HOLSTER BLD 10FT (ELECTRODE) ×3 IMPLANT
PLATE ACE 100DEG 3HOLE (Plate) ×3 IMPLANT
PLATE ACE 100DEG 7HOLE (Plate) ×3 IMPLANT
PLATE ACE 3.5MM 2HOLE (Plate) ×3 IMPLANT
PLATE SPIDER 16 (Washer) ×3 IMPLANT
SANITIZER HAND PURELL 535ML FO (MISCELLANEOUS) ×3 IMPLANT
SCREW ACE CAN 4.0 40M (Screw) ×3 IMPLANT
SCREW ACE CAN 4.0 44M (Screw) ×3 IMPLANT
SCREW CORTICAL 3.5MM  12MM (Screw) ×4 IMPLANT
SCREW CORTICAL 3.5MM  16MM (Screw) ×4 IMPLANT
SCREW CORTICAL 3.5MM  20MM (Screw) ×2 IMPLANT
SCREW CORTICAL 3.5MM  30MM (Screw) ×2 IMPLANT
SCREW CORTICAL 3.5MM  42MM (Screw) ×2 IMPLANT
SCREW CORTICAL 3.5MM 12MM (Screw) ×2 IMPLANT
SCREW CORTICAL 3.5MM 14MM (Screw) ×9 IMPLANT
SCREW CORTICAL 3.5MM 16MM (Screw) ×2 IMPLANT
SCREW CORTICAL 3.5MM 18MM (Screw) ×3 IMPLANT
SCREW CORTICAL 3.5MM 20MM (Screw) ×1 IMPLANT
SCREW CORTICAL 3.5MM 30MM (Screw) ×1 IMPLANT
SCREW CORTICAL 3.5MM 42MM (Screw) ×1 IMPLANT
SHEET MEDIUM DRAPE 40X70 STRL (DRAPES) ×3 IMPLANT
SLEEVE SCD COMPRESS KNEE MED (MISCELLANEOUS) ×3 IMPLANT
SPLINT FAST PLASTER 5X30 (CAST SUPPLIES) ×40
SPLINT PLASTER CAST FAST 5X30 (CAST SUPPLIES) ×20 IMPLANT
SPONGE LAP 18X18 RF (DISPOSABLE) ×6 IMPLANT
STAPLER VISISTAT 35W (STAPLE) ×3 IMPLANT
STOCKINETTE 6  STRL (DRAPES) ×2
STOCKINETTE 6 STRL (DRAPES) ×1 IMPLANT
SUCTION FRAZIER HANDLE 10FR (MISCELLANEOUS) ×2
SUCTION TUBE FRAZIER 10FR DISP (MISCELLANEOUS) ×1 IMPLANT
SUT ETHILON 3 0 PS 1 (SUTURE) ×3 IMPLANT
SUT FIBERWIRE #2 38 T-5 BLUE (SUTURE)
SUT MNCRL AB 3-0 PS2 18 (SUTURE) IMPLANT
SUT VIC AB 0 SH 27 (SUTURE) IMPLANT
SUT VIC AB 2-0 SH 27 (SUTURE) ×4
SUT VIC AB 2-0 SH 27XBRD (SUTURE) ×2 IMPLANT
SUTURE FIBERWR #2 38 T-5 BLUE (SUTURE) IMPLANT
SYR BULB 3OZ (MISCELLANEOUS) ×3 IMPLANT
SYR CONTROL 10ML LL (SYRINGE) ×3 IMPLANT
TOWEL GREEN STERILE FF (TOWEL DISPOSABLE) ×6 IMPLANT
TUBE CONNECTING 20'X1/4 (TUBING) ×1
TUBE CONNECTING 20X1/4 (TUBING) ×2 IMPLANT
UNDERPAD 30X30 (UNDERPADS AND DIAPERS) ×3 IMPLANT
ZIPTIGHT ANKLE SYNODESMOSS FIX (Ankle) ×3 IMPLANT

## 2019-03-22 NOTE — Progress Notes (Signed)
Assisted Dr. Brock with left, ultrasound guided, popliteal/saphenous block. Side rails up, monitors on throughout procedure. See vital signs in flow sheet. Tolerated Procedure well. 

## 2019-03-22 NOTE — Discharge Instructions (Addendum)
Toni ArthursJohn Hewitt, MD [facility]  Please read the following information regarding your care after surgery.  Medications  You only need a prescription for the narcotic pain medicine (ex. oxycodone, Percocet, Norco).  All of the other medicines listed below are available over the counter. X resume Ibuprofen 800mg  for the first 3 days after surgery. X acetominophen (Tylenol) 650 mg every 4-6 hours as you need for minor to moderate pain X oxycodone as prescribed for severe pain  Narcotic pain medicine (ex. oxycodone, Percocet, Vicodin) will cause constipation.  To prevent this problem, take the following medicines while you are taking any pain medicine. X docusate sodium (Colace) 100 mg twice a day X senna (Senokot) 2 tablets twice a day  X To help prevent blood clots, take a baby aspirin (81 mg) twice a day after surgery.  You should also get up every hour while you are awake to move around.    Weight Bearing X Do not bear any weight on the operated leg or foot.  Cast / Splint / Dressing X Keep your splint, cast or dressing clean and dry.  Dont put anything (coat hanger, pencil, etc) down inside of it.  If it gets damp, use a hair dryer on the cool setting to dry it.  If it gets soaked, call the office to schedule an appointment for a cast change.   After your dressing, cast or splint is removed; you may shower, but do not soak or scrub the wound.  Allow the water to run over it, and then gently pat it dry.  Swelling It is normal for you to have swelling where you had surgery.  To reduce swelling and pain, keep your toes above your nose for at least 3 days after surgery.  It may be necessary to keep your foot or leg elevated for several weeks.  If it hurts, it should be elevated.  Follow Up Call my office at [phone] when you are discharged from the hospital or surgery center to schedule an appointment to be seen two weeks after surgery.  Call my office at [phone] if you  develop a fever >101.5 F, nausea, vomiting, bleeding from the surgical site or severe pain.     Post Anesthesia Home Care Instructions  Activity: Get plenty of rest for the remainder of the day. A responsible individual must stay with you for 24 hours following the procedure.  For the next 24 hours, DO NOT: -Drive a car -Advertising copywriterperate machinery -Drink alcoholic beverages -Take any medication unless instructed by your physician -Make any legal decisions or sign important papers.  Meals: Start with liquid foods such as gelatin or soup. Progress to regular foods as tolerated. Avoid greasy, spicy, heavy foods. If nausea and/or vomiting occur, drink only clear liquids until the nausea and/or vomiting subsides. Call your physician if vomiting continues.  Special Instructions/Symptoms: Your throat may feel dry or sore from the anesthesia or the breathing tube placed in your throat during surgery. If this causes discomfort, gargle with warm salt water. The discomfort should disappear within 24 hours.  If you had a scopolamine patch placed behind your ear for the management of post- operative nausea and/or vomiting:  1. The medication in the patch is effective for 72 hours, after which it should be removed.  Wrap patch in a tissue and discard in the trash. Wash hands thoroughly with soap and water. 2. You may remove the patch earlier than 72 hours if you experience unpleasant side effects which may  include dry mouth, dizziness or visual disturbances. 3. Avoid touching the patch. Wash your hands with soap and water after contact with the patch.    Regional Anesthesia Blocks  1. Numbness or the inability to move the "blocked" extremity may last from 3-48 hours after placement. The length of time depends on the medication injected and your individual response to the medication. If the numbness is not going away after 48 hours, call your surgeon.  2. The extremity that is blocked will need to be  protected until the numbness is gone and the  Strength has returned. Because you cannot feel it, you will need to take extra care to avoid injury. Because it may be weak, you may have difficulty moving it or using it. You may not know what position it is in without looking at it while the block is in effect.  3. For blocks in the legs and feet, returning to weight bearing and walking needs to be done carefully. You will need to wait until the numbness is entirely gone and the strength has returned. You should be able to move your leg and foot normally before you try and bear weight or walk. You will need someone to be with you when you first try to ensure you do not fall and possibly risk injury.  4. Bruising and tenderness at the needle site are common side effects and will resolve in a few days.  5. Persistent numbness or new problems with movement should be communicated to the surgeon or the [facility] [phone] [facility] [phone]).

## 2019-03-22 NOTE — Anesthesia Procedure Notes (Signed)
Anesthesia Regional Block: Adductor canal block   Pre-Anesthetic Checklist: ,, timeout performed, Correct Patient, Correct Site, Correct Laterality, Correct Procedure, Correct Position, site marked, Risks and benefits discussed,  Surgical consent,  Pre-op evaluation,  At surgeon's request and post-op pain management  Laterality: Left  Prep: chloraprep       Needles:  Injection technique: Single-shot  Needle Type: Echogenic Needle     Needle Length: 10cm  Needle Gauge: 21     Additional Needles:   Narrative:  Start time: 03/22/2019 7:10 AM End time: 03/22/2019 7:13 AM Injection made incrementally with aspirations every 5 mL.  Performed by: Personally  Anesthesiologist: Beryle Lathe, MD  Additional Notes: No pain on injection. No increased resistance to injection. Injection made in 5cc increments. Good needle visualization. Patient tolerated the procedure well.

## 2019-03-22 NOTE — Op Note (Signed)
03/22/2019  10:17 AM  PATIENT:  Kylie Cook  38 y.o. female  PRE-OPERATIVE DIAGNOSIS: 1.  Left trimalleolar ankle fracture - closed and displaced      2.  Left ankle synodesmosis disruption  POST-OPERATIVE DIAGNOSIS: same  Procedure(s): 1.  Open treatment of left trimalleolar ankle fracture with internal fixation including fixation of the posterior lip 2.  Open treatment of left ankle syndesmosis disruption with internal fixation 3.  Stress examination of the left ankle under fluoroscopy 4.  AP, mortise and lateral radiographs of the left ankle   SURGEON:  Toni Arthurs, MD  ASSISTANT: Alfredo Martinez, PA-C  ANESTHESIA:   General, regional  EBL:  minimal   TOURNIQUET:   Total Tourniquet Time Documented: Thigh (Left) - 120 minutes Total: Thigh (Left) - 120 minutes  COMPLICATIONS:  None apparent  DISPOSITION:  Extubated, awake and stable to recovery.  INDICATION FOR PROCEDURE: The patient is a 38 year old female without significant past medical history.  She injured her ankle hiking over a week ago.  Radiographs and a CT scan revealed a comminuted trimalleolar fracture with disruption of the syndesmosis.  She presents now for operative treatment of this displaced and unstable left ankle injury.  The risks and benefits of the alternative treatment options have been discussed in detail.  The patient wishes to proceed with surgery and specifically understands risks of bleeding, infection, nerve damage, blood clots, need for additional surgery, amputation and death.  PROCEDURE IN DETAIL:  After pre operative consent was obtained, and the correct operative site was identified, the patient was brought to the operating room and placed supine on the OR table.  Anesthesia was administered.  Pre-operative antibiotics were administered.  A surgical timeout was taken.  The patient was then turned into the lateral decubitus position and a beanbag.  The left lower extremity was prepped and draped in  standard sterile fashion with a tourniquet around the thigh.  The extremity was elevated and the tourniquet was inflated to 300 mmHg.  A posterior lateral incision was made, and dissection was carried down through the subcutaneous tissues.  Care was taken to protect branches of the sural nerve.  The interval between the peroneals and the flexor houses longus tendon was developed exposing the posterior malleolus fracture site.  Periosteum was incised and elevated exposing the fracture site.  The patient's left lower extremity was then externally rotated exposing the medial malleolus.  An incision was made over the medial malleolus, and dissection was carried down through the subcutaneous tissues to the fracture site.  The posterior and medial aspect of the posterior malleolus fracture was identified.  The fracture was mobilized through the fracture plane and provisionally reduced.  Attention was then returned to the posterior lateral incision where a K wire was used to provisionally stabilize the fracture.  AP and lateral radiographs confirmed appropriate reduction of the posterior malleolus fracture.  A 4 mm partially-threaded cannulated screw was inserted compressing the fracture site appropriately.  A 3 hole one third tubular plate from the [employer] small frag set was then placed over the apex of the fracture site as a buttress plate.  It was secured proximally and distally with fully threaded 3.5 mm bicortical screws.  Attention was then returned to the medial incision.  The medial malleolus fracture was reduced and provisionally pinned.  Radiographs confirmed appropriate reduction.  A 4 mm partially-threaded cannulated screw with a spider washer was placed over the guidewire.  This was noted to have excellent purchase and compressed  the fracture site appropriately.  Attention was then returned to the lateral incision where the fibular fracture was identified.  The fracture was reduced and a 7 hole one third  tubular plate was applied and held with a lobster claw.  The plate was secured proximally with 3 bicortical screws and distally with 3 bicortical screws.  AP, mortise and lateral radiographs confirmed appropriate position and length of all hardware and appropriate reduction of the posterior, medial and lateral malleolus fractures.  A stress examination was then performed.  Dorsiflexion and external rotation stress was applied to the supinated forefoot.  The ankle mortise was noted to widen through the syndesmosis.  The syndesmosis was then reduced.  The most distal screw in the lateral plate was removed.  A hole was drilled across all 4 cortices of the distal fibula and tibia.  A [employer] tight rope was then inserted through this hole and the medial button flipped appropriately.  The syndesmosis was then held in a reduced position as the tight rope was tightened securely.  AP, mortise and lateral radiographs confirmed appropriate reduction of the syndesmosis.  The posterior medial fracture fragment was noted to still be unstable.  It was reduced and fixed with a 2 hole one third tubular plate.  Final AP, lateral and mortise radiographs confirmed appropriate position and length of all hardware and appropriate reduction of the fracture sites.  The medial and lateral wounds were irrigated copiously.  The deep subcutaneous tissues were approximated with 2-0 Vicryl simple sutures.  The skin incisions were closed with staples.  Sterile dressings were applied followed by a well-padded short leg splint.  The tourniquet was released after application of the dressings.  The patient was awakened from anesthesia and transported to the recovery room in stable condition.   FOLLOW UP PLAN: Nonweightbearing on the left lower extremity for 6 weeks postop.  Follow-up in 2 weeks for suture removal and conversion to a short leg cast.  Aspirin 81 mg p.o. twice daily for DVT prophylaxis.   RADIOGRAPHS: AP, mortise  and lateral radiographs of the left ankle are obtained intraoperatively.  These show interval reduction and fixation of the medial, lateral and posterior malleolus fractures.  Syndesmosis is also appropriately reduced.  Hardware is appropriately positioned and of the appropriate lengths.    Alfredo MartinezJustin Ollis PA-C was present and scrubbed for the duration of the operative case. His assistance was essential in positioning the patient, prepping and draping, gaining and maintaining exposure, performing the operation, closing and dressing the wounds and applying the splint.

## 2019-03-22 NOTE — H&P (Signed)
Kylie Cook is an 38 y.o. female.   Chief Complaint: left ankle pain HPI: The patient is a 38 year old female without significant past medical history.  She fell hiking last week injuring her left ankle.  She has a displaced and unstable trimalleolar ankle fracture with syndesmosis disruption.  She presents now for operative treatment of this displaced and unstable left ankle injury.  Past Medical History:  Diagnosis Date  . Closed left ankle fracture   . Complication of anesthesia   . PONV (postoperative nausea and vomiting)     Past Surgical History:  Procedure Laterality Date  . CERVICAL CONE BIOPSY     x2  . WISDOM TOOTH EXTRACTION      Family History  Problem Relation Age of Onset  . Diabetes Mother   . Hypertension Father    Social History:  reports that she quit smoking about 16 years ago. She has never used smokeless tobacco. She reports current alcohol use. She reports that she does not use drugs.  Allergies:  Allergies  Allergen Reactions  . Sulfa Antibiotics Hives    Medications Prior to Admission  Medication Sig Dispense Refill  . fluticasone (FLONASE) 50 MCG/ACT nasal spray Place 2 sprays into both nostrils daily. 16 g 6  . FOLIC ACID PO Take by mouth.    Marland Kitchen ibuprofen (ADVIL,MOTRIN) 800 MG tablet Take 800 mg by mouth 3 (three) times daily.    . Multiple Vitamins-Minerals (MULTI-VITAMIN GUMMIES PO) Take by mouth.      No results found for this or any previous visit (from the past 48 hour(s)). No results found.  ROS no recent fever, chills, nausea, vomiting or changes in her appetite  Blood pressure (!) 149/99, pulse (!) 119, temperature 98.7 F (37.1 C), temperature source Oral, resp. rate 16, height 5' 7.5" (1.715 m), weight 102.6 kg, last menstrual period 02/26/2019, SpO2 99 %. Physical Exam  Well-nourished well-developed woman in no apparent distress.  Alert and oriented x4.  Mood and affect are normal.  Extraocular motions are intact.  Respirations are  unlabored.  Gait is nonweightbearing on the left.  The left ankle has healthy skin.  Pulses are palpable in the foot.  Sensibility to light touch is intact dorsally and plantarly at the forefoot.  5 out of 5 strength in plantar flexion and dorsiflexion of the toes.  Assessment/Plan Left ankle trimalleolar fracture and syndesmosis disruption -to the operating room today for open treatment with internal fixation.  The risks and benefits of the alternative treatment options have been discussed in detail.  The patient wishes to proceed with surgery and specifically understands risks of bleeding, infection, nerve damage, blood clots, need for additional surgery, amputation and death.   Toni Arthurs, MD 04-20-19, 7:21 AM

## 2019-03-22 NOTE — Transfer of Care (Signed)
Immediate Anesthesia Transfer of Care Note  Patient: Kylie Cook  Procedure(s) Performed: Open treatment of left ankle trimalleolar fracture and syndesmosis injury with internal fixation (Left Ankle)  Patient Location: PACU  Anesthesia Type:GA combined with regional for post-op pain  Level of Consciousness: drowsy and patient cooperative  Airway & Oxygen Therapy: Patient Spontanous Breathing and Patient connected to face mask oxygen  Post-op Assessment: Report given to RN and Post -op Vital signs reviewed and stable  Post vital signs: Reviewed and stable  Last Vitals:  Vitals Value Taken Time  BP    Temp    Pulse 111 03/22/2019 10:06 AM  Resp 19 03/22/2019 10:06 AM  SpO2 99 % 03/22/2019 10:06 AM  Vitals shown include unvalidated device data.  Last Pain:  Vitals:   03/22/19 0710  TempSrc:   PainSc: 2       Patients Stated Pain Goal: 1 (03/22/19 0703)  Complications: No apparent anesthesia complications

## 2019-03-22 NOTE — Anesthesia Procedure Notes (Signed)
Anesthesia Regional Block: Popliteal block   Pre-Anesthetic Checklist: ,, timeout performed, Correct Patient, Correct Site, Correct Laterality, Correct Procedure, Correct Position, site marked, Risks and benefits discussed,  Surgical consent,  Pre-op evaluation,  At surgeon's request and post-op pain management  Laterality: Left  Prep: chloraprep       Needles:  Injection technique: Single-shot  Needle Type: Echogenic Needle     Needle Length: 10cm  Needle Gauge: 21     Additional Needles:   Narrative:  Start time: 03/22/2019 7:15 AM End time: 03/22/2019 7:19 AM Injection made incrementally with aspirations every 5 mL.  Performed by: Personally  Anesthesiologist: Beryle Lathe, MD  Additional Notes: No pain on injection. No increased resistance to injection. Injection made in 5cc increments. Good needle visualization. Patient tolerated the procedure well.

## 2019-03-22 NOTE — Anesthesia Postprocedure Evaluation (Signed)
Anesthesia Post Note  Patient: Silvana Mathern  Procedure(s) Performed: Open treatment of left ankle trimalleolar fracture and syndesmosis injury with internal fixation (Left Ankle)     Patient location during evaluation: PACU Anesthesia Type: General Level of consciousness: awake and alert Pain management: pain level controlled Vital Signs Assessment: post-procedure vital signs reviewed and stable Respiratory status: spontaneous breathing, nonlabored ventilation and respiratory function stable Cardiovascular status: blood pressure returned to baseline and stable Postop Assessment: no apparent nausea or vomiting Anesthetic complications: no    Last Vitals:  Vitals:   03/22/19 1030 03/22/19 1042  BP: 134/85 129/75  Pulse: (!) 117 (!) 113  Resp: 12 12  Temp:    SpO2: 100% 97%    Last Pain:  Vitals:   03/22/19 1042  TempSrc:   PainSc: 0-No pain                 Beryle Lathe

## 2019-03-22 NOTE — Anesthesia Procedure Notes (Signed)
Procedure Name: LMA Insertion Date/Time: 03/22/2019 7:35 AM Performed by: Sheryn Bison, CRNA Pre-anesthesia Checklist: Patient identified, Emergency Drugs available, Suction available and Patient being monitored Patient Re-evaluated:Patient Re-evaluated prior to induction Oxygen Delivery Method: Circle system utilized Preoxygenation: Pre-oxygenation with 100% oxygen Induction Type: IV induction Ventilation: Mask ventilation without difficulty LMA: LMA inserted LMA Size: 4.0 Number of attempts: 1 Airway Equipment and Method: Bite block Placement Confirmation: positive ETCO2 Tube secured with: Tape Dental Injury: Teeth and Oropharynx as per pre-operative assessment

## 2019-03-23 ENCOUNTER — Encounter (HOSPITAL_BASED_OUTPATIENT_CLINIC_OR_DEPARTMENT_OTHER): Payer: Self-pay | Admitting: Orthopedic Surgery

## 2019-05-12 IMAGING — CT CT OF THE LEFT ANKLE WITHOUT CONTRAST
2 series · 15 of 20 positions shown, 18 images · non-contrast
Comparison: None.

CLINICAL DATA: Left ankle pain and swelling after a fall several
days ago.

EXAM:
CT OF THE LEFT ANKLE WITHOUT CONTRAST
TECHNIQUE: Multidetector CT imaging of the left ankle was performed according
to the standard protocol. Multiplanar CT image reconstructions were
also generated.

[Series 4: soft tissue lower extremity · axial · 0.31mm/px · z∈[-218,-70]mm · 12 of 88 slices shown, 15 images]
[im 7/88  soft-tissue]
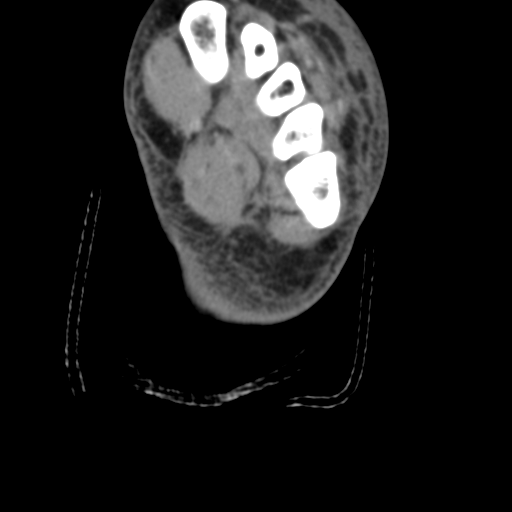
[im 7/88  bone]
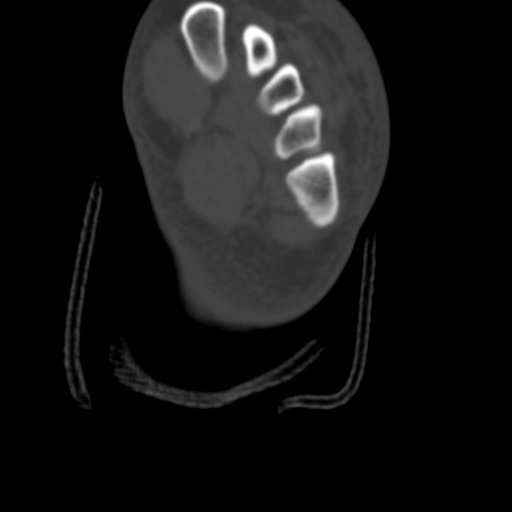
[im 14/88  bone]
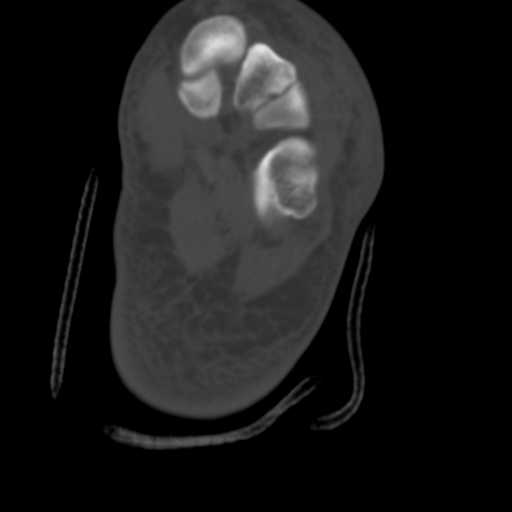
[im 21/88  bone]
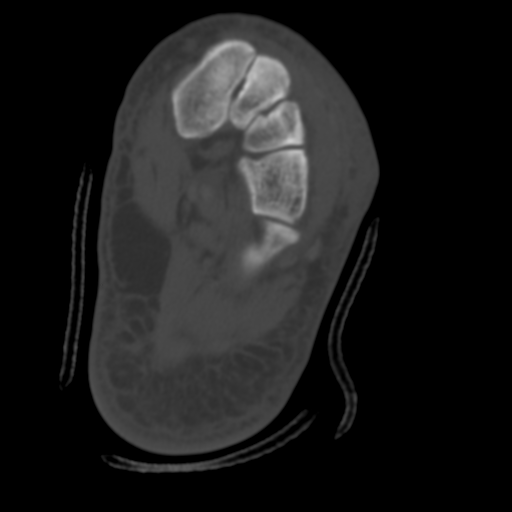
[im 27/88  bone]
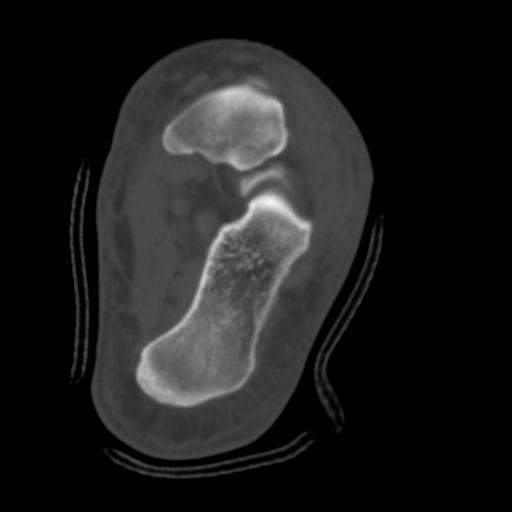
[im 34/88  soft-tissue]
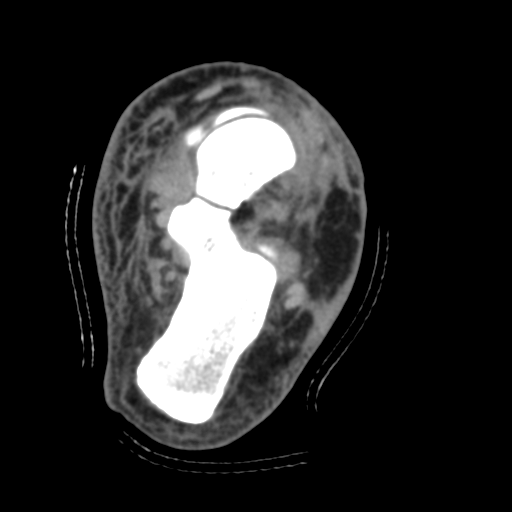
[im 34/88  bone]
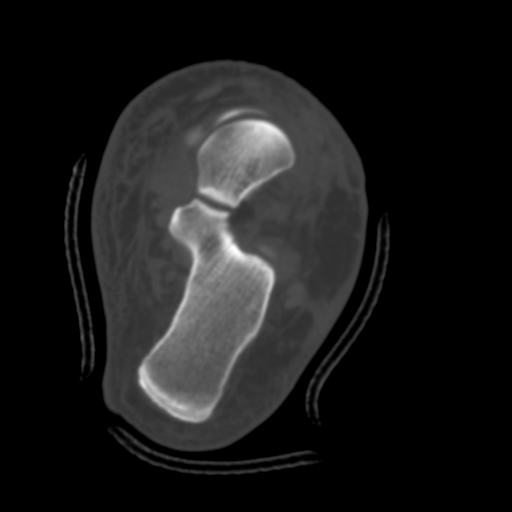
[im 41/88  bone]
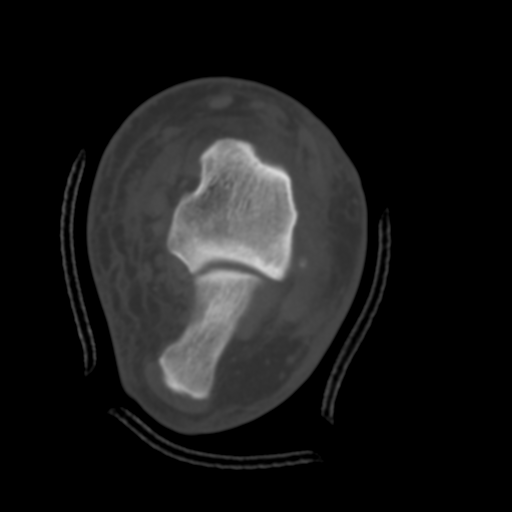
[im 47/88  bone]
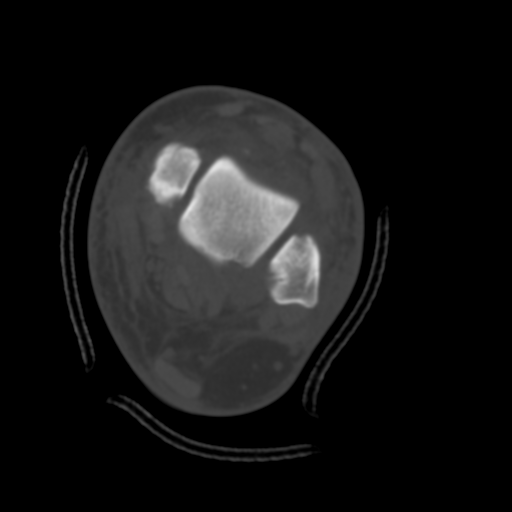
[im 54/88  bone]
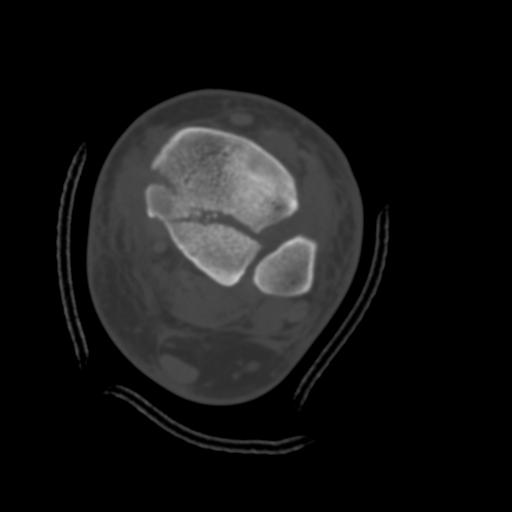
[im 61/88  soft-tissue]
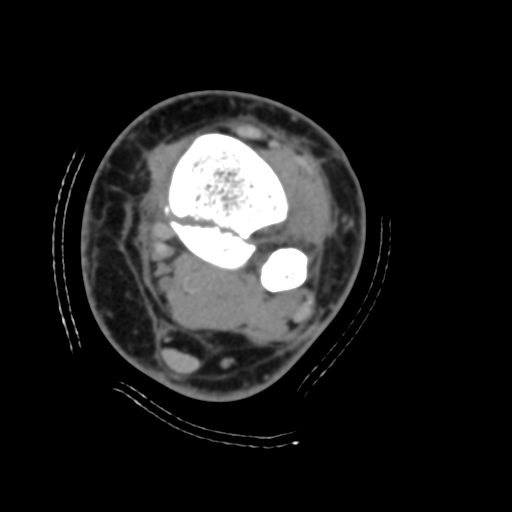
[im 61/88  bone]
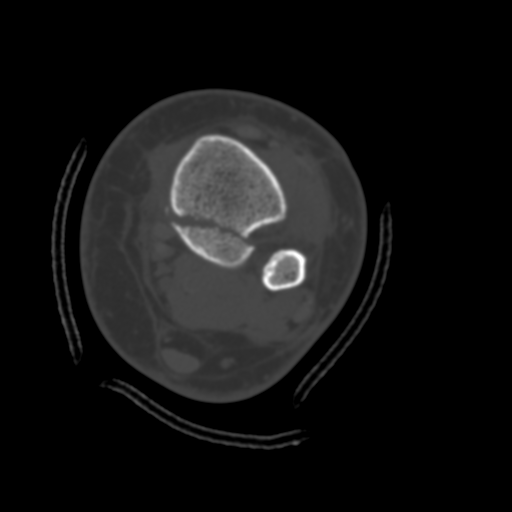
[im 67/88  bone]
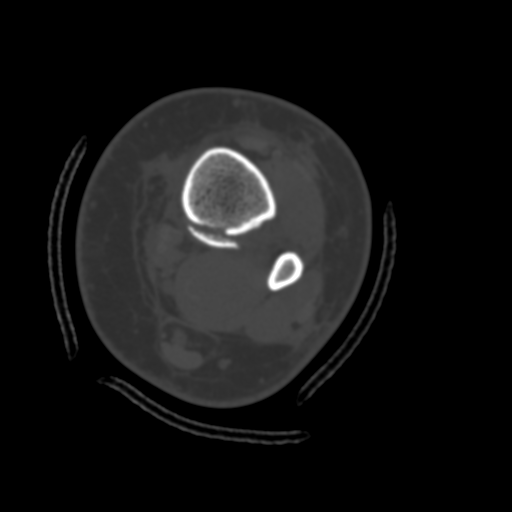
[im 74/88  bone]
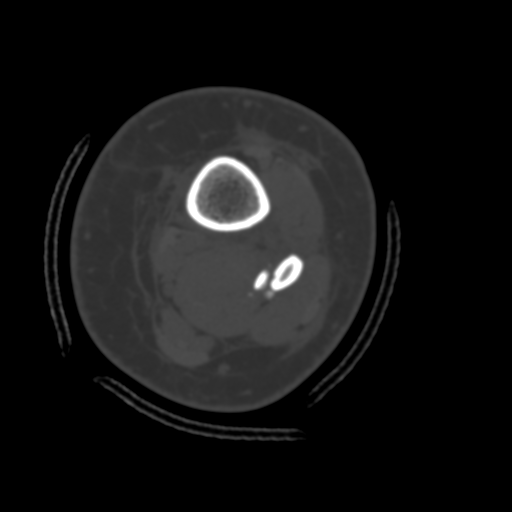
[im 81/88  bone]
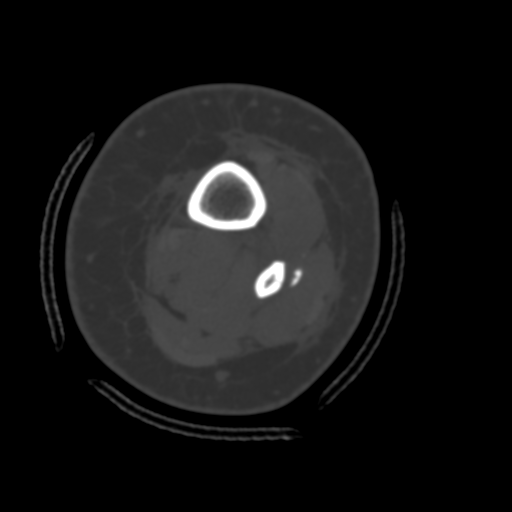

[Series 9: cor soft tissue · coronal · 0.34mm/px · 3 of 56 slices shown]
[im 12/56  bone]
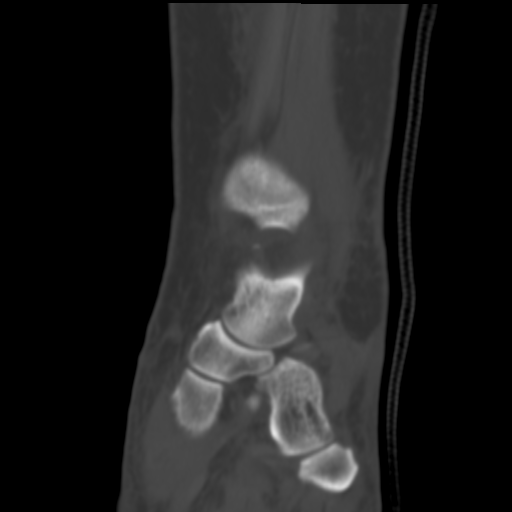
[im 23/56  bone]
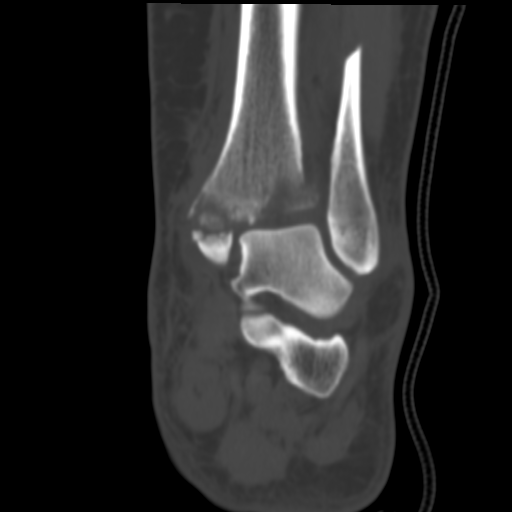
[im 34/56  bone]
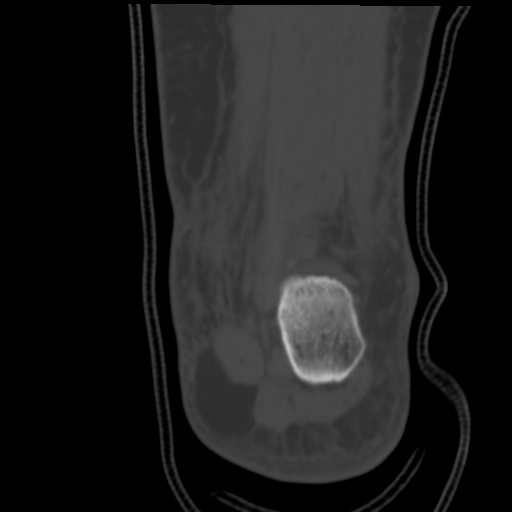

[15 of 20 positions shown; findings below may reference images not displayed]

FINDINGS: Bones/Joint/Cartilage/Ligaments

There is a slightly comminuted fracture of the posterior malleolus
of the distal tibia involving approximately 40% of articular
surface. There is a comminuted fracture base of medial malleolus.
There is approximately 2.5 mm of lateral subluxation of medial
malleolus and talus with respect to the tibia. There is widening of
the anterior aspect of the distal tibiofibular joint space
consistent with at least partial disruption of the syndesmosis.

There is also a slightly displaced spiral fracture of the distal
fibular shaft. There is a tiny avulsion bone from the anterior
aspect of the distal fibula at the level of the anterior
tibiofibular ligament.

Ankle effusion or hemarthrosis. The visualized bones of the foot are
intact.

Muscles and Tendons

Negative.

Soft tissues

Soft tissue swelling adjacent to the mediolateral aspects of the
ankle.
IMPRESSION: Fractures of the distal tibia and fibula as described. Probable
disruption of the anterior tibiofibular ligament and at least
partial disruption of the distal tibiofibular syndesmosis.

## 2020-04-09 DIAGNOSIS — M722 Plantar fascial fibromatosis: Secondary | ICD-10-CM | POA: Insufficient documentation

## 2021-01-28 DIAGNOSIS — E669 Obesity, unspecified: Secondary | ICD-10-CM | POA: Insufficient documentation

## 2021-01-28 DIAGNOSIS — R030 Elevated blood-pressure reading, without diagnosis of hypertension: Secondary | ICD-10-CM | POA: Insufficient documentation

## 2021-01-28 DIAGNOSIS — E66812 Obesity, class 2: Secondary | ICD-10-CM | POA: Insufficient documentation

## 2021-06-17 DIAGNOSIS — O3443 Maternal care for other abnormalities of cervix, third trimester: Secondary | ICD-10-CM | POA: Insufficient documentation

## 2021-06-17 DIAGNOSIS — O09523 Supervision of elderly multigravida, third trimester: Secondary | ICD-10-CM | POA: Insufficient documentation

## 2021-08-28 DIAGNOSIS — O43192 Other malformation of placenta, second trimester: Secondary | ICD-10-CM | POA: Insufficient documentation

## 2022-01-02 DIAGNOSIS — Z3490 Encounter for supervision of normal pregnancy, unspecified, unspecified trimester: Secondary | ICD-10-CM | POA: Insufficient documentation

## 2022-03-25 ENCOUNTER — Encounter: Payer: Self-pay | Admitting: Family Medicine

## 2022-03-25 ENCOUNTER — Ambulatory Visit: Payer: BC Managed Care – PPO | Admitting: Family Medicine

## 2022-03-25 VITALS — BP 153/80 | HR 72 | Temp 98.6°F | Ht 67.5 in | Wt 230.0 lb

## 2022-03-25 DIAGNOSIS — Z7689 Persons encountering health services in other specified circumstances: Secondary | ICD-10-CM

## 2022-03-25 DIAGNOSIS — I10 Essential (primary) hypertension: Secondary | ICD-10-CM | POA: Diagnosis not present

## 2022-03-25 DIAGNOSIS — Z6835 Body mass index (BMI) 35.0-35.9, adult: Secondary | ICD-10-CM | POA: Diagnosis not present

## 2022-03-25 LAB — URINALYSIS
Bilirubin, UA: NEGATIVE
Glucose, UA: NEGATIVE
Ketones, UA: NEGATIVE
Leukocytes,UA: NEGATIVE
Nitrite, UA: NEGATIVE
Protein,UA: NEGATIVE
RBC, UA: NEGATIVE
Specific Gravity, UA: 1.01 (ref 1.005–1.030)
Urobilinogen, Ur: 0.2 mg/dL (ref 0.2–1.0)
pH, UA: 6 (ref 5.0–7.5)

## 2022-03-25 MED ORDER — HYDROCHLOROTHIAZIDE 25 MG PO TABS: 25.0000 mg | ORAL_TABLET | Freq: Every day | ORAL | 3 refills | Status: DC

## 2022-03-25 NOTE — Patient Instructions (Signed)

## 2022-03-25 NOTE — Progress Notes (Signed)
?  ? ?Subjective:  ?Patient ID: Kylie Cook, female    DOB: 05/31/1981, 41 y.o.   MRN: [id number] ? ?Patient Care Team: ?Baruch Gouty, FNP as PCP - General (Family Medicine)  ? ?Chief Complaint:  New Patient (Initial Visit) (Elevated B/P) ? ? ?HPI: ?Kylie Cook is a 41 y.o. female presenting on 03/25/2022 for New Patient (Initial Visit) (Elevated B/P) ? ? ?Pt presents today to reestablish care with PCP. She was last seen over 3 years ago. She had a baby 01/2022. She was hypertensive at end of pregnancy and after delivering. She was on Nifedipine but this was discontinued at her 6 week postpartum follow up. She was told to establish with her PCP for management of hypertension. She has not had workup for potential underlying causes. She does report some lower extremity swelling at times, more on left ankle from prior trauma. She denies headache, dizziness, weakness, chest pain, palpitations, visual changes, or syncope. No focal neurological deficits. She is not breast feeding.  ? ?Relevant past medical, surgical, family, and social history reviewed and updated as indicated.  ?Allergies and medications reviewed and updated. Data reviewed: Chart in Epic. ? ? ?Past Medical History:  ?Diagnosis Date  ? Closed left ankle fracture   ? Complication of anesthesia   ? Dermatographia   ? PONV (postoperative nausea and vomiting)   ? ? ?Past Surgical History:  ?Procedure Laterality Date  ? CERVICAL CONE BIOPSY    ? x2  ? ORIF ANKLE FRACTURE Left 03/22/2019  ? Procedure: Open treatment of left ankle trimalleolar fracture and syndesmosis injury with internal fixation;  Surgeon: Wylene Simmer, MD;  Location: [facility];  Service: Orthopedics;  Laterality: Left;  171min  ? WISDOM TOOTH EXTRACTION    ? ? ?Social History  ? ?Socioeconomic History  ? Marital status: Married  ?  Spouse name: Not on file  ? Number of children: Not on file  ? Years of education: Not on file  ? Highest education level: Not on file   ?Occupational History  ? Not on file  ?Tobacco Use  ? Smoking status: Former  ?  Types: Cigarettes  ?  Quit date: 12/01/2002  ?  Years since quitting: 19.3  ? Smokeless tobacco: Never  ?Vaping Use  ? Vaping Use: Never used  ?Substance and Sexual Activity  ? Alcohol use: Yes  ?  Comment: occ  ? Drug use: No  ? Sexual activity: Not on file  ?Other Topics Concern  ? Not on file  ?Social History Narrative  ? Not on file  ? ?Social Determinants of Health  ? ?Financial Resource Strain: Not on file  ?Food Insecurity: Not on file  ?Transportation Needs: Not on file  ?Physical Activity: Not on file  ?Stress: Not on file  ?Social Connections: Not on file  ?Intimate Partner Violence: Not on file  ? ? ?Outpatient Encounter Medications as of 03/25/2022  ?Medication Sig  ? aspirin EC 81 MG tablet Take 1 tablet (81 mg total) by mouth 2 (two) times daily.  ? docusate sodium (COLACE) 100 MG capsule Take 1 capsule (100 mg total) by mouth 2 (two) times daily. While taking narcotic pain medicine.  ? fluticasone (FLONASE) 50 MCG/ACT nasal spray Place 2 sprays into both nostrils daily.  ? hydrochlorothiazide (HYDRODIURIL) 25 MG tablet Take 1 tablet (25 mg total) by mouth daily.  ? levonorgestrel-ethinyl estradiol (ALESSE) 0.1-20 MG-MCG tablet Take 1 tablet by mouth daily.  ? Multiple Vitamins-Minerals (MULTI-VITAMIN GUMMIES PO) Take  by mouth.  ? [DISCONTINUED] FOLIC ACID PO Take by mouth.  ? [DISCONTINUED] folic acid-pyridoxine-cyancobalamin (FOLTX) 2.5-25-2 MG TABS tablet Take 1 tablet by mouth daily.  ? [DISCONTINUED] ibuprofen (ADVIL,MOTRIN) 800 MG tablet Take 800 mg by mouth 3 (three) times daily.  ? [DISCONTINUED] senna (SENOKOT) 8.6 MG TABS tablet Take 2 tablets (17.2 mg total) by mouth 2 (two) times daily.  ? ?No facility-administered encounter medications on file as of 03/25/2022.  ? ? ?Allergies  ?Allergen Reactions  ? Sulfa Antibiotics Hives  ? ? ?Review of Systems  ?Constitutional:  Negative for activity change, appetite change,  chills, diaphoresis, fatigue, fever and unexpected weight change.  ?HENT: Negative.    ?Eyes: Negative.  Negative for photophobia and visual disturbance.  ?Respiratory:  Negative for cough, chest tightness and shortness of breath.   ?Cardiovascular:  Positive for leg swelling. Negative for chest pain and palpitations.  ?Gastrointestinal:  Negative for abdominal pain, blood in stool, constipation, diarrhea, nausea and vomiting.  ?Endocrine: Negative.   ?Genitourinary:  Negative for decreased urine volume, difficulty urinating, dysuria, frequency and urgency.  ?Musculoskeletal:  Negative for arthralgias and myalgias.  ?Skin: Negative.   ?Allergic/Immunologic: Negative.   ?Neurological:  Negative for dizziness, tremors, seizures, syncope, facial asymmetry, speech difficulty, weakness, light-headedness, numbness and headaches.  ?Hematological: Negative.   ?Psychiatric/Behavioral:  Negative for confusion, hallucinations, sleep disturbance and suicidal ideas.   ?All other systems reviewed and are negative. ? ?   ? ?Objective:  ?BP (!) 153/80   Pulse 72   Temp 98.6 ?F (37 ?C)   Ht 5' 7.5" (1.715 m)   Wt 230 lb (104.3 kg)   SpO2 97%   BMI 35.49 kg/m?   ? ?Wt Readings from Last 3 Encounters:  ?03/25/22 230 lb (104.3 kg)  ?03/22/19 226 lb 3.1 oz (102.6 kg)  ?01/14/19 233 lb (105.7 kg)  ? ? ?Physical Exam ?Vitals and nursing note reviewed.  ?Constitutional:   ?   General: She is not in acute distress. ?   Appearance: Normal appearance. She is well-developed and well-groomed. She is obese. She is not ill-appearing, toxic-appearing or diaphoretic.  ?HENT:  ?   Head: Normocephalic and atraumatic.  ?   Jaw: There is normal jaw occlusion.  ?   Right Ear: Hearing normal.  ?   Left Ear: Hearing normal.  ?   Nose: Nose normal.  ?   Mouth/Throat:  ?   Lips: Pink.  ?   Mouth: Mucous membranes are moist.  ?   Pharynx: Oropharynx is clear. Uvula midline.  ?Eyes:  ?   General: Lids are normal.  ?   Extraocular Movements: Extraocular  movements intact.  ?   Conjunctiva/sclera: Conjunctivae normal.  ?   Pupils: Pupils are equal, round, and reactive to light.  ?Neck:  ?   Thyroid: No thyroid mass, thyromegaly or thyroid tenderness.  ?   Vascular: No carotid bruit or JVD.  ?   Trachea: Trachea and phonation normal.  ?Cardiovascular:  ?   Rate and Rhythm: Regular rhythm. Bradycardia present.  ?   Chest Wall: PMI is not displaced.  ?   Heart sounds: Normal heart sounds. No murmur heard. ?  No friction rub. No gallop.  ?Pulmonary:  ?   Effort: Pulmonary effort is normal.  ?   Breath sounds: Normal breath sounds.  ?Abdominal:  ?   General: Bowel sounds are normal. There is no abdominal bruit.  ?   Palpations: Abdomen is soft. There is no hepatomegaly or  splenomegaly.  ?Musculoskeletal:     ?   General: Normal range of motion.  ?   Cervical back: Normal range of motion and neck supple.  ?   Right lower leg: Edema present.  ?   Left lower leg: Edema present.  ?Lymphadenopathy:  ?   Cervical: No cervical adenopathy.  ?Skin: ?   General: Skin is warm and dry.  ?   Capillary Refill: Capillary refill takes less than 2 seconds.  ?   Coloration: Skin is not cyanotic, jaundiced or pale.  ?   Findings: No rash.  ?   Comments: Facial flushing  ?Neurological:  ?   General: No focal deficit present.  ?   Mental Status: She is alert and oriented to person, place, and time.  ?   Sensory: Sensation is intact.  ?   Motor: Motor function is intact.  ?   Coordination: Coordination is intact.  ?   Gait: Gait is intact.  ?   Deep Tendon Reflexes: Reflexes are normal and symmetric.  ?Psychiatric:     ?   Attention and Perception: Attention and perception normal.     ?   Mood and Affect: Mood and affect normal.     ?   Speech: Speech normal.     ?   Behavior: Behavior normal. Behavior is cooperative.     ?   Thought Content: Thought content normal.     ?   Cognition and Memory: Cognition and memory normal.     ?   Judgment: Judgment normal.  ? ? ?Results for orders placed or  performed in visit on 01/14/19  ?Rapid Strep Screen ([facility] ONLY)  ? Specimen: Other  ? OTHER  ?Result Value Ref Range  ? Strep Gp A Ag, IA W/Reflex Negative Negative  ?Culture, Group A Strep  ? Specimen: Terie Purser

## 2022-03-26 LAB — CBC WITH DIFFERENTIAL/PLATELET
Basophils Absolute: 0.1 10*3/uL (ref 0.0–0.2)
Basos: 1 %
EOS (ABSOLUTE): 0.2 10*3/uL (ref 0.0–0.4)
Eos: 4 %
Hematocrit: 41.5 % (ref 34.0–46.6)
Hemoglobin: 14 g/dL (ref 11.1–15.9)
Immature Grans (Abs): 0 10*3/uL (ref 0.0–0.1)
Immature Granulocytes: 0 %
Lymphocytes Absolute: 2.4 10*3/uL (ref 0.7–3.1)
Lymphs: 47 %
MCH: 27.7 pg (ref 26.6–33.0)
MCHC: 33.7 g/dL (ref 31.5–35.7)
MCV: 82 fL (ref 79–97)
Monocytes Absolute: 0.4 10*3/uL (ref 0.1–0.9)
Monocytes: 7 %
Neutrophils Absolute: 2.1 10*3/uL (ref 1.4–7.0)
Neutrophils: 41 %
Platelets: 342 10*3/uL (ref 150–450)
RBC: 5.06 x10E6/uL (ref 3.77–5.28)
RDW: 13.9 % (ref 11.7–15.4)
WBC: 5.2 10*3/uL (ref 3.4–10.8)

## 2022-03-26 LAB — MICROALBUMIN / CREATININE URINE RATIO
Creatinine, Urine: 45.9 mg/dL
Microalb/Creat Ratio: 9 mg/g creat (ref 0–29)
Microalbumin, Urine: 4 ug/mL

## 2022-03-26 LAB — CMP14+EGFR
ALT: 14 IU/L (ref 0–32)
AST: 20 IU/L (ref 0–40)
Albumin/Globulin Ratio: 1.7 (ref 1.2–2.2)
Albumin: 4.5 g/dL (ref 3.8–4.8)
Alkaline Phosphatase: 67 IU/L (ref 44–121)
BUN/Creatinine Ratio: 12 (ref 9–23)
BUN: 10 mg/dL (ref 6–20)
Bilirubin Total: 0.3 mg/dL (ref 0.0–1.2)
CO2: 23 mmol/L (ref 20–29)
Calcium: 10.3 mg/dL — ABNORMAL HIGH (ref 8.7–10.2)
Chloride: 104 mmol/L (ref 96–106)
Creatinine, Ser: 0.81 mg/dL (ref 0.57–1.00)
Globulin, Total: 2.7 g/dL (ref 1.5–4.5)
Glucose: 92 mg/dL (ref 70–99)
Potassium: 4.6 mmol/L (ref 3.5–5.2)
Sodium: 142 mmol/L (ref 134–144)
Total Protein: 7.2 g/dL (ref 6.0–8.5)
eGFR: 95 mL/min/{1.73_m2} (ref >59)

## 2022-03-26 LAB — THYROID PANEL WITH TSH
Free Thyroxine Index: 2 (ref 1.2–4.9)
T3 Uptake Ratio: 19 % — ABNORMAL LOW (ref 24–39)
T4, Total: 10.5 ug/dL (ref 4.5–12.0)
TSH: 2.78 u[IU]/mL (ref 0.450–4.500)

## 2022-03-26 LAB — LIPID PANEL
Chol/HDL Ratio: 6.3 ratio — ABNORMAL HIGH (ref 0.0–4.4)
Cholesterol, Total: 264 mg/dL — ABNORMAL HIGH (ref 100–199)
HDL: 42 mg/dL (ref >39)
LDL Chol Calc (NIH): 171 mg/dL — ABNORMAL HIGH (ref 0–99)
Triglycerides: 268 mg/dL — ABNORMAL HIGH (ref 0–149)
VLDL Cholesterol Cal: 51 mg/dL — ABNORMAL HIGH (ref 5–40)

## 2022-04-10 ENCOUNTER — Ambulatory Visit: Payer: BC Managed Care – PPO | Admitting: Family Medicine

## 2022-04-10 ENCOUNTER — Encounter: Payer: Self-pay | Admitting: Family Medicine

## 2022-04-10 VITALS — BP 136/84 | HR 68 | Temp 98.4°F | Ht 67.5 in | Wt 229.0 lb

## 2022-04-10 DIAGNOSIS — I10 Essential (primary) hypertension: Secondary | ICD-10-CM

## 2022-04-10 NOTE — Progress Notes (Signed)
?  ? ?Subjective:  ?Patient ID: Kylie Cook, female    DOB: 06-16-1981, 41 y.o.   MRN: [id number] ? ?Patient Care Team: ?Baruch Gouty, FNP as PCP - General (Family Medicine)  ? ?Chief Complaint:  Hypertension ? ? ?HPI: ?Kylie Cook is a 41 y.o. female presenting on 04/10/2022 for Hypertension ? ? ?Pt presenting today for hypertension follow up. She was placed on HCTZ 2 weeks ago and has been tolerating well.  ? ?Hypertension ?This is a chronic problem. The problem has been rapidly improving since onset. The problem is controlled. Pertinent negatives include no anxiety, blurred vision, chest pain, headaches, malaise/fatigue, neck pain, orthopnea, palpitations, peripheral edema, PND, shortness of breath or sweats. Agents associated with hypertension include estrogens. Risk factors for coronary artery disease include obesity. Past treatments include diuretics. The current treatment provides significant improvement.  ? ? ?Relevant past medical, surgical, family, and social history reviewed and updated as indicated.  ?Allergies and medications reviewed and updated. Data reviewed: Chart in Epic. ? ? ?Past Medical History:  ?Diagnosis Date  ? Closed left ankle fracture   ? Complication of anesthesia   ? Dermatographia   ? PONV (postoperative nausea and vomiting)   ? ? ?Past Surgical History:  ?Procedure Laterality Date  ? CERVICAL CONE BIOPSY    ? x2  ? ORIF ANKLE FRACTURE Left 03/22/2019  ? Procedure: Open treatment of left ankle trimalleolar fracture and syndesmosis injury with internal fixation;  Surgeon: Wylene Simmer, MD;  Location: [facility];  Service: Orthopedics;  Laterality: Left;  130min  ? WISDOM TOOTH EXTRACTION    ? ? ?Social History  ? ?Socioeconomic History  ? Marital status: Married  ?  Spouse name: Not on file  ? Number of children: Not on file  ? Years of education: Not on file  ? Highest education level: Not on file  ?Occupational History  ? Not on file  ?Tobacco Use  ? Smoking status:  Former  ?  Types: Cigarettes  ?  Quit date: 12/01/2002  ?  Years since quitting: 19.3  ? Smokeless tobacco: Never  ?Vaping Use  ? Vaping Use: Never used  ?Substance and Sexual Activity  ? Alcohol use: Yes  ?  Comment: occ  ? Drug use: No  ? Sexual activity: Not on file  ?Other Topics Concern  ? Not on file  ?Social History Narrative  ? Not on file  ? ?Social Determinants of Health  ? ?Financial Resource Strain: Not on file  ?Food Insecurity: Not on file  ?Transportation Needs: Not on file  ?Physical Activity: Not on file  ?Stress: Not on file  ?Social Connections: Not on file  ?Intimate Partner Violence: Not on file  ? ? ?Outpatient Encounter Medications as of 04/10/2022  ?Medication Sig  ? aspirin EC 81 MG tablet Take 1 tablet (81 mg total) by mouth 2 (two) times daily.  ? docusate sodium (COLACE) 100 MG capsule Take 1 capsule (100 mg total) by mouth 2 (two) times daily. While taking narcotic pain medicine.  ? fluticasone (FLONASE) 50 MCG/ACT nasal spray Place 2 sprays into both nostrils daily.  ? hydrochlorothiazide (HYDRODIURIL) 25 MG tablet Take 1 tablet (25 mg total) by mouth daily.  ? levonorgestrel-ethinyl estradiol (ALESSE) 0.1-20 MG-MCG tablet Take 1 tablet by mouth daily.  ? Multiple Vitamins-Minerals (MULTI-VITAMIN GUMMIES PO) Take by mouth.  ? ?No facility-administered encounter medications on file as of 04/10/2022.  ? ? ?Allergies  ?Allergen Reactions  ? Sulfa Antibiotics Hives  ? ? ?  Review of Systems  ?Constitutional:  Negative for activity change, appetite change, chills, diaphoresis, fatigue, fever, malaise/fatigue and unexpected weight change.  ?HENT: Negative.    ?Eyes: Negative.  Negative for blurred vision, photophobia and visual disturbance.  ?Respiratory:  Negative for cough, chest tightness and shortness of breath.   ?Cardiovascular:  Negative for chest pain, palpitations, orthopnea, leg swelling and PND.  ?Gastrointestinal:  Negative for abdominal pain, blood in stool, constipation, diarrhea,  nausea and vomiting.  ?Endocrine: Negative.   ?Genitourinary:  Negative for decreased urine volume, difficulty urinating, dysuria, frequency and urgency.  ?Musculoskeletal:  Negative for arthralgias, myalgias and neck pain.  ?Skin: Negative.   ?Allergic/Immunologic: Negative.   ?Neurological:  Negative for dizziness, tremors, seizures, syncope, facial asymmetry, speech difficulty, weakness, light-headedness, numbness and headaches.  ?Hematological: Negative.   ?Psychiatric/Behavioral:  Negative for confusion, hallucinations, sleep disturbance and suicidal ideas.   ?All other systems reviewed and are negative. ? ?   ? ?Objective:  ?BP 136/84   Pulse 68   Temp 98.4 ?F (36.9 ?C)   Ht 5' 7.5" (1.715 m)   Wt 229 lb (103.9 kg)   LMP 03/07/2022 (Exact Date)   SpO2 97%   BMI 35.34 kg/m?   ? ?Wt Readings from Last 3 Encounters:  ?04/10/22 229 lb (103.9 kg)  ?03/25/22 230 lb (104.3 kg)  ?03/22/19 226 lb 3.1 oz (102.6 kg)  ? ? ?Physical Exam ?Vitals and nursing note reviewed.  ?Constitutional:   ?   General: She is not in acute distress. ?   Appearance: Normal appearance. She is obese. She is not ill-appearing, toxic-appearing or diaphoretic.  ?HENT:  ?   Head: Normocephalic and atraumatic.  ?Eyes:  ?   Pupils: Pupils are equal, round, and reactive to light.  ?Cardiovascular:  ?   Rate and Rhythm: Normal rate and regular rhythm.  ?   Heart sounds: Normal heart sounds.  ?Pulmonary:  ?   Effort: Pulmonary effort is normal.  ?   Breath sounds: Normal breath sounds.  ?Musculoskeletal:  ?   Cervical back: Normal range of motion and neck supple.  ?   Right lower leg: No edema.  ?   Left lower leg: No edema.  ?Skin: ?   General: Skin is warm and dry.  ?   Capillary Refill: Capillary refill takes less than 2 seconds.  ?Neurological:  ?   General: No focal deficit present.  ?   Mental Status: She is alert and oriented to person, place, and time.  ?Psychiatric:     ?   Mood and Affect: Mood normal.     ?   Behavior: Behavior  normal.     ?   Thought Content: Thought content normal.     ?   Judgment: Judgment normal.  ? ? ?Results for orders placed or performed in visit on 03/25/22  ?CMP14+EGFR  ?Result Value Ref Range  ? Glucose 92 70 - 99 mg/dL  ? BUN 10 6 - 20 mg/dL  ? Creatinine, Ser 0.81 0.57 - 1.00 mg/dL  ? eGFR 95 >59 mL/min/1.73  ? BUN/Creatinine Ratio 12 9 - 23  ? Sodium 142 134 - 144 mmol/L  ? Potassium 4.6 3.5 - 5.2 mmol/L  ? Chloride 104 96 - 106 mmol/L  ? CO2 23 20 - 29 mmol/L  ? Calcium 10.3 (H) 8.7 - 10.2 mg/dL  ? Total Protein 7.2 6.0 - 8.5 g/dL  ? Albumin 4.5 3.8 - 4.8 g/dL  ? Globulin, Total 2.7 1.5 -  4.5 g/dL  ? Albumin/Globulin Ratio 1.7 1.2 - 2.2  ? Bilirubin Total 0.3 0.0 - 1.2 mg/dL  ? Alkaline Phosphatase 67 44 - 121 IU/L  ? AST 20 0 - 40 IU/L  ? ALT 14 0 - 32 IU/L  ?CBC with Differential/Platelet  ?Result Value Ref Range  ? WBC 5.2 3.4 - 10.8 x10E3/uL  ? RBC 5.06 3.77 - 5.28 x10E6/uL  ? Hemoglobin 14.0 11.1 - 15.9 g/dL  ? Hematocrit 41.5 34.0 - 46.6 %  ? MCV 82 79 - 97 fL  ? MCH 27.7 26.6 - 33.0 pg  ? MCHC 33.7 31.5 - 35.7 g/dL  ? RDW 13.9 11.7 - 15.4 %  ? Platelets 342 150 - 450 x10E3/uL  ? Neutrophils 41 Not Estab. %  ? Lymphs 47 Not Estab. %  ? Monocytes 7 Not Estab. %  ? Eos 4 Not Estab. %  ? Basos 1 Not Estab. %  ? Neutrophils Absolute 2.1 1.4 - 7.0 x10E3/uL  ? Lymphocytes Absolute 2.4 0.7 - 3.1 x10E3/uL  ? Monocytes Absolute 0.4 0.1 - 0.9 x10E3/uL  ? EOS (ABSOLUTE) 0.2 0.0 - 0.4 x10E3/uL  ? Basophils Absolute 0.1 0.0 - 0.2 x10E3/uL  ? Immature Granulocytes 0 Not Estab. %  ? Immature Grans (Abs) 0.0 0.0 - 0.1 x10E3/uL  ?Lipid panel  ?Result Value Ref Range  ? Cholesterol, Total 264 (H) 100 - 199 mg/dL  ? Triglycerides 268 (H) 0 - 149 mg/dL  ? HDL 42 >39 mg/dL  ? VLDL Cholesterol Cal 51 (H) 5 - 40 mg/dL  ? LDL Chol Calc (NIH) 171 (H) 0 - 99 mg/dL  ? Chol/HDL Ratio 6.3 (H) 0.0 - 4.4 ratio  ?Thyroid Panel With TSH  ?Result Value Ref Range  ? TSH 2.780 0.450 - 4.500 uIU/mL  ? T4, Total 10.5 4.5 - 12.0 ug/dL  ? T3  Uptake Ratio 19 (L) 24 - 39 %  ? Free Thyroxine Index 2.0 1.2 - 4.9  ?Microalbumin / creatinine urine ratio  ?Result Value Ref Range  ? Creatinine, Urine 45.9 Not Estab. mg/dL  ? Microalbumin, Urine 4.0 Not Estab.

## 2022-04-10 NOTE — Patient Instructions (Signed)

## 2022-04-11 LAB — BMP8+EGFR
BUN/Creatinine Ratio: 15 (ref 9–23)
BUN: 12 mg/dL (ref 6–20)
CO2: 25 mmol/L (ref 20–29)
Calcium: 10.2 mg/dL (ref 8.7–10.2)
Chloride: 101 mmol/L (ref 96–106)
Creatinine, Ser: 0.82 mg/dL (ref 0.57–1.00)
Glucose: 103 mg/dL — ABNORMAL HIGH (ref 70–99)
Potassium: 4 mmol/L (ref 3.5–5.2)
Sodium: 140 mmol/L (ref 134–144)
eGFR: 93 mL/min/{1.73_m2} (ref >59)

## 2022-10-15 ENCOUNTER — Ambulatory Visit: Payer: BC Managed Care – PPO | Admitting: Family Medicine

## 2022-10-17 ENCOUNTER — Encounter: Payer: Self-pay | Admitting: Family Medicine

## 2022-10-17 ENCOUNTER — Ambulatory Visit: Payer: BC Managed Care – PPO | Admitting: Family Medicine

## 2022-10-17 VITALS — BP 124/72 | HR 85 | Temp 97.6°F | Ht 67.5 in | Wt 225.8 lb

## 2022-10-17 DIAGNOSIS — I10 Essential (primary) hypertension: Secondary | ICD-10-CM

## 2022-10-17 DIAGNOSIS — Z1231 Encounter for screening mammogram for malignant neoplasm of breast: Secondary | ICD-10-CM | POA: Diagnosis not present

## 2022-10-17 DIAGNOSIS — E782 Mixed hyperlipidemia: Secondary | ICD-10-CM

## 2022-10-17 DIAGNOSIS — Z6835 Body mass index (BMI) 35.0-35.9, adult: Secondary | ICD-10-CM

## 2022-10-17 DIAGNOSIS — E785 Hyperlipidemia, unspecified: Secondary | ICD-10-CM | POA: Insufficient documentation

## 2022-10-17 LAB — LIPID PANEL

## 2022-10-17 NOTE — Progress Notes (Signed)
Subjective:  Patient ID: Kylie Cook, female    DOB: [birth year], 41 y.o.   MRN: [id number]  Patient Care Team: Baruch Gouty, FNP as PCP - General (Family Medicine)   Chief Complaint:  Hypertension and Hyperlipidemia (6 month follow up)   HPI: Kylie Cook is a 41 y.o. female presenting on 10/17/2022 for Hypertension and Hyperlipidemia (6 month follow up)   Hypertension This is a chronic problem. The current episode started more than 1 month ago. The problem has been waxing and waning since onset. The problem is controlled (great readings at home). Pertinent negatives include no anxiety, blurred vision, chest pain, headaches, malaise/fatigue, neck pain, orthopnea, palpitations, peripheral edema, PND, shortness of breath or sweats. There are no associated agents to hypertension. Risk factors for coronary artery disease include obesity. Past treatments include diuretics. The current treatment provides significant improvement. Compliance problems include diet and exercise.  There is no history of angina, kidney disease, CAD/MI, CVA, heart failure, left ventricular hypertrophy, PVD or retinopathy. There is no history of chronic renal disease.  Hyperlipidemia This is a new problem. Recent lipid tests were reviewed and are high. Exacerbating diseases include obesity. She has no history of chronic renal disease, diabetes, hypothyroidism, liver disease or nephrotic syndrome. Factors aggravating her hyperlipidemia include thiazides. Pertinent negatives include no chest pain, focal sensory loss, focal weakness, leg pain, myalgias or shortness of breath. Current antihyperlipidemic treatment includes herbal therapy (Red Yeast Rice). Compliance problems include adherence to diet and adherence to exercise.  Risk factors for coronary artery disease include hypertension and obesity.    Relevant past medical, surgical, family, and social history reviewed and updated as indicated.  Allergies and  medications reviewed and updated. Data reviewed: Chart in Epic.   Past Medical History:  Diagnosis Date   Closed left ankle fracture    Complication of anesthesia    Dermatographia    PONV (postoperative nausea and vomiting)     Past Surgical History:  Procedure Laterality Date   CERVICAL CONE BIOPSY     x2   ORIF ANKLE FRACTURE Left 03/22/2019   Procedure: Open treatment of left ankle trimalleolar fracture and syndesmosis injury with internal fixation;  Surgeon: Wylene Simmer, MD;  Location: [facility];  Service: Orthopedics;  Laterality: Left;  155mn   WISDOM TOOTH EXTRACTION      Social History   Socioeconomic History   Marital status: Married    Spouse name: Not on file   Number of children: Not on file   Years of education: Not on file   Highest education level: Not on file  Occupational History   Not on file  Tobacco Use   Smoking status: Former    Types: Cigarettes    Quit date: 12/01/2002    Years since quitting: 19.8   Smokeless tobacco: Never  Vaping Use   Vaping Use: Never used  Substance and Sexual Activity   Alcohol use: Yes    Comment: occ   Drug use: No   Sexual activity: Not on file  Other Topics Concern   Not on file  Social History Narrative   Not on file   Social Determinants of Health   Financial Resource Strain: Not on file  Food Insecurity: Not on file  Transportation Needs: Not on file  Physical Activity: Not on file  Stress: Not on file  Social Connections: Not on file  Intimate Partner Violence: Not on file    Outpatient Encounter Medications as of 10/17/2022  Medication Sig   docusate sodium (COLACE) 100 MG capsule Take 1 capsule (100 mg total) by mouth 2 (two) times daily. While taking narcotic pain medicine.   fluticasone (FLONASE) 50 MCG/ACT nasal spray Place 2 sprays into both nostrils daily.   hydrochlorothiazide (HYDRODIURIL) 25 MG tablet Take 1 tablet (25 mg total) by mouth daily.   levonorgestrel-ethinyl  estradiol (ALESSE) 0.1-20 MG-MCG tablet Take 1 tablet by mouth daily.   Multiple Vitamins-Minerals (MULTI-VITAMIN GUMMIES PO) Take by mouth.   [DISCONTINUED] aspirin EC 81 MG tablet Take 1 tablet (81 mg total) by mouth 2 (two) times daily.   No facility-administered encounter medications on file as of 10/17/2022.    Allergies  Allergen Reactions   Sulfa Antibiotics Hives and Other (See Comments)    Review of Systems  Constitutional:  Negative for activity change, appetite change, chills, diaphoresis, fatigue, fever, malaise/fatigue and unexpected weight change.  HENT: Negative.    Eyes: Negative.  Negative for blurred vision, photophobia and visual disturbance.  Respiratory:  Negative for cough, chest tightness and shortness of breath.   Cardiovascular:  Negative for chest pain, palpitations, orthopnea, leg swelling and PND.  Gastrointestinal:  Negative for abdominal pain, blood in stool, constipation, diarrhea, nausea and vomiting.  Endocrine: Negative.   Genitourinary:  Negative for decreased urine volume, difficulty urinating, dysuria, frequency and urgency.  Musculoskeletal:  Negative for arthralgias, myalgias and neck pain.  Skin: Negative.   Allergic/Immunologic: Negative.   Neurological:  Negative for dizziness, tremors, focal weakness, seizures, syncope, facial asymmetry, speech difficulty, weakness, light-headedness, numbness and headaches.  Hematological: Negative.   Psychiatric/Behavioral:  Negative for confusion, hallucinations, sleep disturbance and suicidal ideas.   All other systems reviewed and are negative.       Objective:  BP 124/72 Comment: home reading  Pulse 85   Temp 97.6 F (36.4 C) (Temporal)   Ht 5' 7.5" (1.715 m)   Wt 225 lb 12.8 oz (102.4 kg)   LMP 09/21/2022   SpO2 99%   BMI 34.84 kg/m    Wt Readings from Last 3 Encounters:  10/17/22 225 lb 12.8 oz (102.4 kg)  04/10/22 229 lb (103.9 kg)  03/25/22 230 lb (104.3 kg)    Physical Exam Vitals  and nursing note reviewed.  Constitutional:      General: She is not in acute distress.    Appearance: Normal appearance. She is well-developed and well-groomed. She is obese. She is not ill-appearing, toxic-appearing or diaphoretic.  HENT:     Head: Normocephalic and atraumatic.     Jaw: There is normal jaw occlusion.     Right Ear: Hearing normal.     Left Ear: Hearing normal.     Nose: Nose normal.     Mouth/Throat:     Lips: Pink.     Mouth: Mucous membranes are moist.     Pharynx: Uvula midline.  Eyes:     General: Lids are normal.     Pupils: Pupils are equal, round, and reactive to light.  Neck:     Thyroid: No thyroid mass, thyromegaly or thyroid tenderness.     Vascular: No carotid bruit or JVD.     Trachea: Trachea and phonation normal.  Cardiovascular:     Rate and Rhythm: Normal rate and regular rhythm.     Chest Wall: PMI is not displaced.     Pulses: Normal pulses.     Heart sounds: Normal heart sounds. No murmur heard.    No friction rub. No gallop.  Pulmonary:  Effort: Pulmonary effort is normal.     Breath sounds: Normal breath sounds.  Abdominal:     General: There is no abdominal bruit.     Palpations: There is no hepatomegaly or splenomegaly.  Musculoskeletal:        General: Normal range of motion.     Cervical back: Normal range of motion and neck supple.     Right lower leg: No edema.     Left lower leg: No edema.  Lymphadenopathy:     Cervical: No cervical adenopathy.  Skin:    General: Skin is warm and dry.     Capillary Refill: Capillary refill takes less than 2 seconds.     Coloration: Skin is not cyanotic, jaundiced or pale.     Findings: No rash.  Neurological:     General: No focal deficit present.     Mental Status: She is alert and oriented to person, place, and time.     Sensory: Sensation is intact.     Motor: Motor function is intact.     Coordination: Coordination is intact.     Gait: Gait is intact.     Deep Tendon Reflexes:  Reflexes are normal and symmetric.  Psychiatric:        Attention and Perception: Attention and perception normal.        Mood and Affect: Mood and affect normal.        Speech: Speech normal.        Behavior: Behavior normal. Behavior is cooperative.        Thought Content: Thought content normal.        Cognition and Memory: Cognition and memory normal.        Judgment: Judgment normal.     Results for orders placed or performed in visit on 04/10/22  BMP8+EGFR  Result Value Ref Range   Glucose 103 (H) 70 - 99 mg/dL   BUN 12 6 - 20 mg/dL   Creatinine, Ser 0.82 0.57 - 1.00 mg/dL   eGFR 93 >59 mL/min/1.73   BUN/Creatinine Ratio 15 9 - 23   Sodium 140 134 - 144 mmol/L   Potassium 4.0 3.5 - 5.2 mmol/L   Chloride 101 96 - 106 mmol/L   CO2 25 20 - 29 mmol/L   Calcium 10.2 8.7 - 10.2 mg/dL       Pertinent labs & imaging results that were available during my care of the patient were reviewed by me and considered in my medical decision making.  Assessment & Plan:  Krishana was seen today for hypertension and hyperlipidemia.  Diagnoses and all orders for this visit:  Primary hypertension BP well controlled. Changes were not made in regimen today. Goal BP is 130/80. Pt aware to report any persistent high or low readings. DASH diet and exercise encouraged. Exercise at least 150 minutes per week and increase as tolerated. Goal BMI > 25. Stress management encouraged. Avoid nicotine and tobacco product use. Avoid excessive alcohol and NSAID's. Avoid more than 2000 mg of sodium daily. Medications as prescribed. Follow up as scheduled.  -     CBC with Differential/Platelet -     CMP14+EGFR -     Lipid panel -     Thyroid Panel With TSH  Mixed hyperlipidemia Diet encouraged - increase intake of fresh fruits and vegetables, increase intake of lean proteins. Bake, broil, or grill foods. Avoid fried, greasy, and fatty foods. Avoid fast foods. Increase intake of fiber-rich whole grains. Exercise  encouraged - at  least 150 minutes per week and advance as tolerated.  -     CMP14+EGFR -     Lipid panel  Body mass index (BMI) 35.0-35.9, adult Diet and exercise encouraged. Labs pending.  -     CBC with Differential/Platelet -     CMP14+EGFR -     Lipid panel -     Thyroid Panel With TSH  Encounter for screening mammogram for malignant neoplasm of breast Mammogram ordered  -     MM Digital Screening; Future     Continue all other maintenance medications.  Follow up plan: Return in about 1 year (around 10/18/2023), or if symptoms worsen or fail to improve, for chronic managment.   Continue healthy lifestyle choices, including diet (rich in fruits, vegetables, and lean proteins, and low in salt and simple carbohydrates) and exercise (at least 30 minutes of moderate physical activity daily).  Educational handout given for DASH diet, HTN  The above assessment and management plan was discussed with the patient. The patient verbalized understanding of and has agreed to the management plan. Patient is aware to call the clinic if they develop any new symptoms or if symptoms persist or worsen. Patient is aware when to return to the clinic for a follow-up visit. Patient educated on when it is appropriate to go to the emergency department.   Monia Pouch, FNP-C [facility] [phone]

## 2022-10-17 NOTE — Patient Instructions (Signed)

## 2022-10-18 LAB — CBC WITH DIFFERENTIAL/PLATELET
Basophils Absolute: 0.1 10*3/uL (ref 0.0–0.2)
Basos: 1 %
EOS (ABSOLUTE): 0.2 10*3/uL (ref 0.0–0.4)
Eos: 3 %
Hematocrit: 43.1 % (ref 34.0–46.6)
Hemoglobin: 15.1 g/dL (ref 11.1–15.9)
Immature Grans (Abs): 0 10*3/uL (ref 0.0–0.1)
Immature Granulocytes: 0 %
Lymphocytes Absolute: 2.3 10*3/uL (ref 0.7–3.1)
Lymphs: 45 %
MCH: 30.9 pg (ref 26.6–33.0)
MCHC: 35 g/dL (ref 31.5–35.7)
MCV: 88 fL (ref 79–97)
Monocytes Absolute: 0.4 10*3/uL (ref 0.1–0.9)
Monocytes: 7 %
Neutrophils Absolute: 2.3 10*3/uL (ref 1.4–7.0)
Neutrophils: 44 %
Platelets: 352 10*3/uL (ref 150–450)
RBC: 4.89 x10E6/uL (ref 3.77–5.28)
RDW: 12.5 % (ref 11.7–15.4)
WBC: 5.2 10*3/uL (ref 3.4–10.8)

## 2022-10-18 LAB — LIPID PANEL
Cholesterol, Total: 225 mg/dL — ABNORMAL HIGH (ref 100–199)
HDL: 38 mg/dL — ABNORMAL LOW (ref >39)
Triglycerides: 216 mg/dL — ABNORMAL HIGH (ref 0–149)
VLDL Cholesterol Cal: 39 mg/dL (ref 5–40)

## 2022-10-18 LAB — CMP14+EGFR
ALT: 16 IU/L (ref 0–32)
AST: 20 IU/L (ref 0–40)
Albumin/Globulin Ratio: 1.6 (ref 1.2–2.2)
Albumin: 4.5 g/dL (ref 3.9–4.9)
Alkaline Phosphatase: 55 IU/L (ref 44–121)
BUN/Creatinine Ratio: 12 (ref 9–23)
BUN: 10 mg/dL (ref 6–24)
Bilirubin Total: 0.3 mg/dL (ref 0.0–1.2)
CO2: 25 mmol/L (ref 20–29)
Calcium: 9.3 mg/dL (ref 8.7–10.2)
Chloride: 99 mmol/L (ref 96–106)
Creatinine, Ser: 0.83 mg/dL (ref 0.57–1.00)
Globulin, Total: 2.8 g/dL (ref 1.5–4.5)
Glucose: 89 mg/dL (ref 70–99)
Potassium: 3.4 mmol/L — ABNORMAL LOW (ref 3.5–5.2)
Sodium: 139 mmol/L (ref 134–144)
Total Protein: 7.3 g/dL (ref 6.0–8.5)
eGFR: 91 mL/min/{1.73_m2} (ref >59)

## 2022-10-18 LAB — THYROID PANEL WITH TSH
Free Thyroxine Index: 2.3 (ref 1.2–4.9)
T3 Uptake Ratio: 18 % — ABNORMAL LOW (ref 24–39)
T4, Total: 12.5 ug/dL — ABNORMAL HIGH (ref 4.5–12.0)
TSH: 2 u[IU]/mL (ref 0.450–4.500)

## 2022-11-18 ENCOUNTER — Encounter: Payer: Self-pay | Admitting: Family Medicine

## 2022-12-25 ENCOUNTER — Encounter: Payer: Self-pay | Admitting: Family Medicine

## 2022-12-25 ENCOUNTER — Ambulatory Visit: Payer: BC Managed Care – PPO | Admitting: Family Medicine

## 2022-12-25 VITALS — BP 129/79 | HR 102 | Temp 100.6°F | Ht 67.5 in | Wt 221.5 lb

## 2022-12-25 DIAGNOSIS — J020 Streptococcal pharyngitis: Secondary | ICD-10-CM

## 2022-12-25 DIAGNOSIS — J029 Acute pharyngitis, unspecified: Secondary | ICD-10-CM | POA: Diagnosis not present

## 2022-12-25 LAB — VERITOR FLU A/B WAIVED
Influenza A: NEGATIVE
Influenza B: NEGATIVE

## 2022-12-25 LAB — RAPID STREP SCREEN (MED CTR MEBANE ONLY): Strep Gp A Ag, IA W/Reflex: POSITIVE — AB

## 2022-12-25 MED ORDER — AMOXICILLIN 500 MG PO CAPS: 1000.0000 mg | ORAL_CAPSULE | Freq: Every day | ORAL | 0 refills | Status: AC

## 2022-12-25 NOTE — Progress Notes (Signed)
Established Patient Office Visit  Subjective   Patient ID: Kylie Cook, female    DOB: Jul 18, 1981  Age: 42 y.o. MRN: [id number]  Chief Complaint  Patient presents with   Sore Throat   Fever   Generalized Body Aches    Sore Throat  This is a new problem. The current episode started yesterday. The problem has been gradually worsening. Neither side of throat is experiencing more pain than the other. The maximum temperature recorded prior to her arrival was 101 - 101.9 F. Associated symptoms include congestion. Pertinent negatives include no abdominal pain, coughing, diarrhea, drooling, ear discharge, ear pain, headaches, hoarse voice, plugged ear sensation, neck pain, shortness of breath, stridor, swollen glands, trouble swallowing or vomiting. She has tried NSAIDs and acetaminophen for the symptoms. The treatment provided no relief.      Review of Systems  HENT:  Positive for congestion. Negative for drooling, ear discharge, ear pain, hoarse voice and trouble swallowing.   Respiratory:  Negative for cough, shortness of breath and stridor.   Gastrointestinal:  Negative for abdominal pain, diarrhea and vomiting.  Musculoskeletal:  Negative for neck pain.  Neurological:  Negative for headaches.      Objective:     BP 129/79   Pulse (!) 102   Temp (!) 100.6 F (38.1 C) (Oral)   Ht 5' 7.5" (1.715 m)   Wt 221 lb 8 oz (100.5 kg)   SpO2 97%   BMI 34.18 kg/m    Physical Exam Vitals and nursing note reviewed.  Constitutional:      General: She is not in acute distress.    Appearance: She is not ill-appearing, toxic-appearing or diaphoretic.  HENT:     Right Ear: Tympanic membrane and ear canal normal.     Left Ear: Tympanic membrane and ear canal normal.     Nose: Congestion present.     Mouth/Throat:     Pharynx: Oropharyngeal exudate and posterior oropharyngeal erythema present. No uvula swelling.     Tonsils: Tonsillar exudate present. 2+ on the right. 2+ on the left.   Cardiovascular:     Rate and Rhythm: Regular rhythm. Tachycardia present.  Pulmonary:     Effort: Pulmonary effort is normal. No respiratory distress.  Neurological:     General: No focal deficit present.     Mental Status: She is alert and oriented to person, place, and time.  Psychiatric:        Mood and Affect: Mood normal.        Behavior: Behavior normal.      No results found for any visits on 12/25/22.    The 10-year ASCVD risk score (Arnett DK, et al., 2019) is: 2.1%    Assessment & Plan:   Paislie was seen today for sore throat, fever and generalized body aches.  Diagnoses and all orders for this visit:  Strep pharyngitis + Rapid strep today. Amoxicillin as below. Discussed symptomatic care and return precautions.  -     Rapid Strep Screen ([facility] ONLY) -     Veritor Flu A/B Waived -     amoxicillin (AMOXIL) 500 MG capsule; Take 2 capsules (1,000 mg total) by mouth daily for 10 days.  Sore throat Negative rapid flu.  -     Rapid Strep Screen ([facility] ONLY) -     Veritor Flu A/B Waived  Return to office for new or worsening symptoms, or if symptoms persist.   The patient indicates understanding of these  issues and agrees with the plan.   Gwenlyn Perking, FNP

## 2023-02-18 ENCOUNTER — Ambulatory Visit: Payer: BC Managed Care – PPO | Admitting: Family Medicine

## 2023-02-18 ENCOUNTER — Encounter: Payer: Self-pay | Admitting: Family Medicine

## 2023-02-18 VITALS — BP 130/65 | HR 68 | Temp 98.0°F | Ht 67.5 in | Wt 223.4 lb

## 2023-02-18 DIAGNOSIS — E782 Mixed hyperlipidemia: Secondary | ICD-10-CM | POA: Diagnosis not present

## 2023-02-18 DIAGNOSIS — I10 Essential (primary) hypertension: Secondary | ICD-10-CM | POA: Diagnosis not present

## 2023-02-18 LAB — LIPID PANEL

## 2023-02-18 NOTE — Progress Notes (Signed)
Subjective:  Patient ID: Kylie Cook, female    DOB: 11-Nov-1981, 42 y.o.   MRN: [id number]  Patient Care Team: Baruch Gouty, FNP as PCP - General (Family Medicine)   Chief Complaint:  Hyperlipidemia (4 month follow up )   HPI: Kylie Cook is a 42 y.o. female presenting on 02/18/2023 for Hyperlipidemia (4 month follow up )   1. Mixed hyperlipidemia Compliant with medications - Yes Current medications - red yeast rice, fish oil Side effects from medications - No Diet - general, has not made dietary changes Exercise - not on a regular basis Lipid Panel     Component Value Date/Time   CHOL 225 (H) 10/17/2022 0934   TRIG 216 (H) 10/17/2022 0934   HDL 38 (L) 10/17/2022 0934   CHOLHDL 5.9 (H) 10/17/2022 0934   LDLCALC 148 (H) 10/17/2022 0934   LABVLDL 39 10/17/2022 0934    2. Primary hypertension Complaint with meds - Yes Current Medications - HCTZ Checking BP at home: yes, 130/70-80 range Exercising Regularly - No Watching Salt intake - Yes Pertinent ROS:  Headache - No Fatigue - No Visual Disturbances - No Chest pain - No Dyspnea - No Palpitations - No LE edema - No They report good compliance with medications and can restate their regimen by memory. No medication side effects.  BP Readings from Last 3 Encounters:  02/18/23 130/65  12/25/22 129/79  10/17/22 124/72        Relevant past medical, surgical, family, and social history reviewed and updated as indicated.  Allergies and medications reviewed and updated. Data reviewed: Chart in Epic.   Past Medical History:  Diagnosis Date   Closed left ankle fracture    Complication of anesthesia    Dermatographia    PONV (postoperative nausea and vomiting)     Past Surgical History:  Procedure Laterality Date   CERVICAL CONE BIOPSY     x2   ORIF ANKLE FRACTURE Left 03/22/2019   Procedure: Open treatment of left ankle trimalleolar fracture and syndesmosis injury with internal fixation;  Surgeon:  Wylene Simmer, MD;  Location: [place];  Service: Orthopedics;  Laterality: Left;  142min   WISDOM TOOTH EXTRACTION      Social History   Socioeconomic History   Marital status: Married    Spouse name: Not on file   Number of children: Not on file   Years of education: Not on file   Highest education level: Bachelor's degree (e.g., BA, AB, BS)  Occupational History   Not on file  Tobacco Use   Smoking status: Former    Types: Cigarettes    Quit date: 12/01/2002    Years since quitting: 20.2   Smokeless tobacco: Never  Vaping Use   Vaping Use: Never used  Substance and Sexual Activity   Alcohol use: Yes    Comment: occ   Drug use: No   Sexual activity: Not on file  Other Topics Concern   Not on file  Social History Narrative   Not on file   Social Determinants of Health   Financial Resource Strain: Medium Risk (02/17/2023)   Overall Financial Resource Strain (CARDIA)    Difficulty of Paying Living Expenses: Somewhat hard  Food Insecurity: No Food Insecurity (02/17/2023)   Hunger Vital Sign    Worried About Running Out of Food in the Last Year: Never true    Ran Out of Food in the Last Year: Never true  Transportation Needs: No Transportation Needs (  02/17/2023)   PRAPARE - Hydrologist (Medical): No    Lack of Transportation (Non-Medical): No  Physical Activity: Insufficiently Active (02/17/2023)   Exercise Vital Sign    Days of Exercise per Week: 3 days    Minutes of Exercise per Session: 40 min  Stress: No Stress Concern Present (02/17/2023)   [place]    Feeling of Stress : Not at all  Social Connections: Moderately Integrated (02/17/2023)   Social Connection and Isolation Panel [NHANES]    Frequency of Communication with Friends and Family: Once a week    Frequency of Social Gatherings with Friends and Family: Never    Attends Religious Services: More than  4 times per year    Active Member of [employer] or Organizations: Yes    Attends Archivist Meetings: More than 4 times per year    Marital Status: Married  Human resources officer Violence: Not on file    Outpatient Encounter Medications as of 02/18/2023  Medication Sig   fluticasone (FLONASE) 50 MCG/ACT nasal spray Place 2 sprays into both nostrils daily.   hydrochlorothiazide (HYDRODIURIL) 25 MG tablet Take 1 tablet (25 mg total) by mouth daily.   levonorgestrel-ethinyl estradiol (ALESSE) 0.1-20 MG-MCG tablet Take 1 tablet by mouth daily.   Multiple Vitamins-Minerals (MULTI-VITAMIN GUMMIES PO) Take by mouth.   Omega-3 Fatty Acids (FISH OIL) 1000 MG CPDR Take by mouth.   Red Yeast Rice Extract (RED YEAST RICE PO) Take by mouth.   No facility-administered encounter medications on file as of 02/18/2023.    Allergies  Allergen Reactions   Sulfa Antibiotics Hives and Other (See Comments)    Review of Systems  Constitutional:  Negative for activity change, appetite change, chills, diaphoresis, fatigue, fever and unexpected weight change.  HENT: Negative.    Eyes: Negative.  Negative for photophobia and visual disturbance.  Respiratory:  Negative for cough, chest tightness and shortness of breath.   Cardiovascular:  Negative for chest pain, palpitations and leg swelling.  Gastrointestinal:  Negative for abdominal pain, blood in stool, constipation, diarrhea, nausea and vomiting.  Endocrine: Negative.  Negative for polydipsia, polyphagia and polyuria.  Genitourinary:  Negative for decreased urine volume, difficulty urinating, dysuria, frequency and urgency.  Musculoskeletal:  Negative for arthralgias and myalgias.  Skin: Negative.   Allergic/Immunologic: Negative.   Neurological:  Negative for dizziness, tremors, seizures, syncope, facial asymmetry, speech difficulty, weakness, light-headedness, numbness and headaches.  Hematological: Negative.   Psychiatric/Behavioral:  Negative for  confusion, hallucinations, sleep disturbance and suicidal ideas.   All other systems reviewed and are negative.       Objective:  BP 130/65   Pulse 68   Temp 98 F (36.7 C) (Temporal)   Ht 5' 7.5" (1.715 m)   Wt 223 lb 6.4 oz (101.3 kg)   LMP 02/08/2023   SpO2 100%   BMI 34.47 kg/m    Wt Readings from Last 3 Encounters:  02/18/23 223 lb 6.4 oz (101.3 kg)  12/25/22 221 lb 8 oz (100.5 kg)  10/17/22 225 lb 12.8 oz (102.4 kg)    Physical Exam Vitals and nursing note reviewed.  Constitutional:      General: She is not in acute distress.    Appearance: Normal appearance. She is well-developed and well-groomed. She is obese. She is not ill-appearing, toxic-appearing or diaphoretic.  HENT:     Head: Normocephalic and atraumatic.     Jaw: There is normal jaw occlusion.  Right Ear: Hearing normal.     Left Ear: Hearing normal.     Nose: Nose normal.     Mouth/Throat:     Lips: Pink.     Mouth: Mucous membranes are moist.     Pharynx: Uvula midline.  Eyes:     General: Lids are normal.     Pupils: Pupils are equal, round, and reactive to light.  Neck:     Trachea: Trachea and phonation normal.  Cardiovascular:     Rate and Rhythm: Normal rate and regular rhythm.     Chest Wall: PMI is not displaced.     Pulses: Normal pulses.     Heart sounds: Normal heart sounds. No murmur heard.    No friction rub. No gallop.  Pulmonary:     Effort: Pulmonary effort is normal. No respiratory distress.     Breath sounds: Normal breath sounds. No wheezing.  Abdominal:     General: There is no abdominal bruit.     Palpations: There is no hepatomegaly or splenomegaly.  Musculoskeletal:     Right lower leg: No edema.     Left lower leg: No edema.  Skin:    General: Skin is warm and dry.     Capillary Refill: Capillary refill takes less than 2 seconds.     Coloration: Skin is not cyanotic, jaundiced or pale.     Findings: No rash.  Neurological:     General: No focal deficit  present.     Mental Status: She is alert and oriented to person, place, and time.     Sensory: Sensation is intact.     Motor: Motor function is intact.     Coordination: Coordination is intact.     Gait: Gait is intact.     Deep Tendon Reflexes: Reflexes are normal and symmetric.  Psychiatric:        Attention and Perception: Attention and perception normal.        Mood and Affect: Mood and affect normal.        Speech: Speech normal.        Behavior: Behavior normal. Behavior is cooperative.        Thought Content: Thought content normal.        Cognition and Memory: Cognition and memory normal.        Judgment: Judgment normal.     Results for orders placed or performed in visit on 12/25/22  Rapid Strep Screen ([facility] ONLY)   Specimen: Other   Other  Result Value Ref Range   Strep Gp A Ag, IA W/Reflex Positive (A) Negative  Veritor Flu A/B Waived  Result Value Ref Range   Influenza A Negative Negative   Influenza B Negative Negative       Pertinent labs & imaging results that were available during my care of the patient were reviewed by me and considered in my medical decision making.  Assessment & Plan:  Rex was seen today for hyperlipidemia.  Diagnoses and all orders for this visit:  Mixed hyperlipidemia Diet encouraged - increase intake of fresh fruits and vegetables, increase intake of lean proteins. Bake, broil, or grill foods. Avoid fried, greasy, and fatty foods. Avoid fast foods. Increase intake of fiber-rich whole grains. Exercise encouraged - at least 150 minutes per week and advance as tolerated.  Goal BMI < 25. Continue medications as prescribed. Follow up in 3-6 months as discussed.  -     BMP8+EGFR -  Lipid panel  Primary hypertension BP well controlled. Changes were not made in regimen today. Goal BP is 130/80. Pt aware to report any persistent high or low readings. DASH diet and exercise encouraged. Exercise at least 150 minutes per week  and increase as tolerated. Goal BMI > 25. Stress management encouraged. Avoid nicotine and tobacco product use. Avoid excessive alcohol and NSAID's. Avoid more than 2000 mg of sodium daily. Medications as prescribed. Follow up as scheduled.  -     BMP8+EGFR -     Lipid panel -     Thyroid Panel With TSH -     CBC with Differential/Platelet     Continue all other maintenance medications.  Follow up plan: Return in about 6 months (around 08/21/2023) for CPE.   Continue healthy lifestyle choices, including diet (rich in fruits, vegetables, and lean proteins, and low in salt and simple carbohydrates) and exercise (at least 30 minutes of moderate physical activity daily).  Educational handout given for hyperlipidemia  The above assessment and management plan was discussed with the patient. The patient verbalized understanding of and has agreed to the management plan. Patient is aware to call the clinic if they develop any new symptoms or if symptoms persist or worsen. Patient is aware when to return to the clinic for a follow-up visit. Patient educated on when it is appropriate to go to the emergency department.   Monia Pouch, FNP-C [facility] [phone]

## 2023-02-19 LAB — LIPID PANEL
Chol/HDL Ratio: 5.3 ratio — ABNORMAL HIGH (ref 0.0–4.4)
Cholesterol, Total: 231 mg/dL — ABNORMAL HIGH (ref 100–199)
HDL: 44 mg/dL (ref >39)
LDL Chol Calc (NIH): 149 mg/dL — ABNORMAL HIGH (ref 0–99)
VLDL Cholesterol Cal: 38 mg/dL (ref 5–40)

## 2023-02-19 LAB — THYROID PANEL WITH TSH
Free Thyroxine Index: 2 (ref 1.2–4.9)
T3 Uptake Ratio: 19 % — ABNORMAL LOW (ref 24–39)
T4, Total: 10.5 ug/dL (ref 4.5–12.0)
TSH: 1.9 u[IU]/mL (ref 0.450–4.500)

## 2023-02-19 LAB — CBC WITH DIFFERENTIAL/PLATELET
Basophils Absolute: 0.1 10*3/uL (ref 0.0–0.2)
Basos: 1 %
EOS (ABSOLUTE): 0.2 10*3/uL (ref 0.0–0.4)
Eos: 4 %
Hematocrit: 41.4 % (ref 34.0–46.6)
Hemoglobin: 14.6 g/dL (ref 11.1–15.9)
Immature Grans (Abs): 0 10*3/uL (ref 0.0–0.1)
Immature Granulocytes: 0 %
Lymphocytes Absolute: 3.2 10*3/uL — ABNORMAL HIGH (ref 0.7–3.1)
Lymphs: 52 %
MCH: 31 pg (ref 26.6–33.0)
MCHC: 35.3 g/dL (ref 31.5–35.7)
MCV: 88 fL (ref 79–97)
Monocytes Absolute: 0.4 10*3/uL (ref 0.1–0.9)
Monocytes: 7 %
Neutrophils Absolute: 2.2 10*3/uL (ref 1.4–7.0)
Neutrophils: 36 %
Platelets: 359 10*3/uL (ref 150–450)
RBC: 4.71 x10E6/uL (ref 3.77–5.28)
RDW: 12.8 % (ref 11.7–15.4)
WBC: 6.2 10*3/uL (ref 3.4–10.8)

## 2023-02-19 LAB — BMP8+EGFR
BUN/Creatinine Ratio: 13 (ref 9–23)
BUN: 10 mg/dL (ref 6–24)
CO2: 23 mmol/L (ref 20–29)
Calcium: 9.7 mg/dL (ref 8.7–10.2)
Chloride: 99 mmol/L (ref 96–106)
Creatinine, Ser: 0.76 mg/dL (ref 0.57–1.00)
Glucose: 80 mg/dL (ref 70–99)
Potassium: 3.5 mmol/L (ref 3.5–5.2)
Sodium: 139 mmol/L (ref 134–144)
eGFR: 102 mL/min/{1.73_m2} (ref >59)

## 2023-02-19 MED ORDER — PRAVASTATIN SODIUM 10 MG PO TABS: 10.0000 mg | ORAL_TABLET | Freq: Every day | ORAL | 3 refills | Status: DC

## 2023-02-19 NOTE — Addendum Note (Signed)
Addended by: Baruch Gouty on: 02/19/2023 04:02 PM   Modules accepted: Orders

## 2023-03-06 ENCOUNTER — Other Ambulatory Visit: Payer: Self-pay | Admitting: Family Medicine

## 2023-03-06 DIAGNOSIS — I10 Essential (primary) hypertension: Secondary | ICD-10-CM

## 2023-08-30 ENCOUNTER — Other Ambulatory Visit: Payer: Self-pay | Admitting: Family Medicine

## 2023-08-30 DIAGNOSIS — I10 Essential (primary) hypertension: Secondary | ICD-10-CM

## 2023-10-20 ENCOUNTER — Encounter: Payer: Self-pay | Admitting: Family Medicine

## 2023-10-20 ENCOUNTER — Ambulatory Visit (INDEPENDENT_AMBULATORY_CARE_PROVIDER_SITE_OTHER): Payer: BC Managed Care – PPO | Admitting: Family Medicine

## 2023-10-20 VITALS — BP 127/76 | HR 82 | Temp 97.7°F | Ht 67.5 in | Wt 223.0 lb

## 2023-10-20 DIAGNOSIS — E559 Vitamin D deficiency, unspecified: Secondary | ICD-10-CM

## 2023-10-20 DIAGNOSIS — Z13220 Encounter for screening for lipoid disorders: Secondary | ICD-10-CM

## 2023-10-20 DIAGNOSIS — Z130 Encounter for screening for diseases of the blood and blood-forming organs and certain disorders involving the immune mechanism: Secondary | ICD-10-CM

## 2023-10-20 DIAGNOSIS — Z1329 Encounter for screening for other suspected endocrine disorder: Secondary | ICD-10-CM

## 2023-10-20 DIAGNOSIS — Z0001 Encounter for general adult medical examination with abnormal findings: Secondary | ICD-10-CM | POA: Diagnosis not present

## 2023-10-20 DIAGNOSIS — Z131 Encounter for screening for diabetes mellitus: Secondary | ICD-10-CM

## 2023-10-20 DIAGNOSIS — Z1159 Encounter for screening for other viral diseases: Secondary | ICD-10-CM

## 2023-10-20 DIAGNOSIS — I10 Essential (primary) hypertension: Secondary | ICD-10-CM | POA: Diagnosis not present

## 2023-10-20 DIAGNOSIS — Z0000 Encounter for general adult medical examination without abnormal findings: Secondary | ICD-10-CM

## 2023-10-20 DIAGNOSIS — Z1231 Encounter for screening mammogram for malignant neoplasm of breast: Secondary | ICD-10-CM

## 2023-10-20 DIAGNOSIS — Z114 Encounter for screening for human immunodeficiency virus [HIV]: Secondary | ICD-10-CM

## 2023-10-20 LAB — BAYER DCA HB A1C WAIVED: HB A1C (BAYER DCA - WAIVED): 5 % (ref 4.8–5.6)

## 2023-10-20 MED ORDER — HYDROCHLOROTHIAZIDE 25 MG PO TABS: 25.0000 mg | ORAL_TABLET | Freq: Every day | ORAL | 1 refills | Status: DC

## 2023-10-20 NOTE — Progress Notes (Signed)
Complete physical exam  Patient: Kylie Cook   DOB: [birth year]   42 y.o. Female  MRN: [id number]  Subjective:    Chief Complaint  Patient presents with   Annual Exam    Kylie Cook is a 42 y.o. female who presents today for a complete physical exam. She reports consuming a general diet. The patient does not participate in regular exercise at present. She generally feels well. She reports sleeping well. She does not have additional problems to discuss today.   Discussed the use of AI scribe software for clinical note transcription with the patient, who gave verbal consent to proceed.  History of Present Illness   The patient, who is due for a physical, reports no significant health complaints. She has been maintaining her health with regular gynecological visits, including PAP smears, with the most recent one scheduled for December. The patient had a mammogram last year and is due for another one next month. She has not had any recent eye examinations, but reports difficulty driving at night, specifically identifying deer on the side of the road. She has regular dental check-ups and reports no current dental issues, despite having generally poor teeth.  The patient's hearing is reportedly not great, but stable. She reports frequent urination, which she attributes to her medication. She experienced significant hair loss after her last pregnancy, but it has since stabilized. She is currently on birth control pills and reports a normal menstrual cycle. She is taking blood pressure medication without any known issues, but reports frequent urination, which she attributes to the medication.  The patient has a family history of ovarian cancer in her grandmother and breast cancer in a cousin. She also reports a significant family history of type 2 diabetes on both sides of the family. The patient is currently on medication, all of which are due for renewal.      Most recent fall risk  assessment:    10/20/2023    8:31 AM  Fall Risk   Falls in the past year? 0  Follow up Falls prevention discussed     Most recent depression screenings:    10/20/2023    8:31 AM 02/18/2023   10:28 AM  PHQ 2/9 Scores  PHQ - 2 Score 0 0  PHQ- 9 Score 2 1    Vision:Not within last year  and Dental: No current dental problems and Receives regular dental care  Patient Active Problem List   Diagnosis Date Noted   Mixed hyperlipidemia 10/17/2022   Primary hypertension 03/25/2022   Body mass index (BMI) 35.0-35.9, adult 03/25/2022   History of cervical cone biopsy affecting care of mother, antepartum, third trimester 06/17/2021   Plantar fasciitis of left foot 04/09/2020   Past Medical History:  Diagnosis Date   Closed left ankle fracture    Complication of anesthesia    Dermatographia    PONV (postoperative nausea and vomiting)    Past Surgical History:  Procedure Laterality Date   CERVICAL CONE BIOPSY     x2   ORIF ANKLE FRACTURE Left 03/22/2019   Procedure: Open treatment of left ankle trimalleolar fracture and syndesmosis injury with internal fixation;  Surgeon: Toni Arthurs, MD;  Location: [facility];  Service: Orthopedics;  Laterality: Left;    WISDOM TOOTH EXTRACTION     Social History   Tobacco Use   Smoking status: Former    Current packs/day: 0.00    Types: Cigarettes    Quit date: 12/01/2002  Years since quitting: 20.8   Smokeless tobacco: Never  Vaping Use   Vaping status: Never Used  Substance Use Topics   Alcohol use: Yes    Comment: occ   Drug use: No   Social History   Socioeconomic History   Marital status: Married    Spouse name: Not on file   Number of children: Not on file   Years of education: Not on file   Highest education level: Bachelor's degree (e.g., BA, AB, BS)  Occupational History   Not on file  Tobacco Use   Smoking status: Former    Current packs/day: 0.00    Types: Cigarettes    Quit date: 12/01/2002     Years since quitting: 20.8   Smokeless tobacco: Never  Vaping Use   Vaping status: Never Used  Substance and Sexual Activity   Alcohol use: Yes    Comment: occ   Drug use: No   Sexual activity: Not on file  Other Topics Concern   Not on file  Social History Narrative   Not on file   Social Determinants of Health   Financial Resource Strain: Medium Risk (02/17/2023)   Overall Financial Resource Strain (CARDIA)    Difficulty of Paying Living Expenses: Somewhat hard  Food Insecurity: No Food Insecurity (02/17/2023)   Hunger Vital Sign    Worried About Running Out of Food in the Last Year: Never true    Ran Out of Food in the Last Year: Never true  Transportation Needs: No Transportation Needs (02/17/2023)   PRAPARE - Administrator, Civil Service (Medical): No    Lack of Transportation (Non-Medical): No  Physical Activity: Insufficiently Active (02/17/2023)   Exercise Vital Sign    Days of Exercise per Week: 3 days    Minutes of Exercise per Session: 40 min  Stress: No Stress Concern Present (02/17/2023)   [employer] of Occupational Health - Occupational Stress Questionnaire    Feeling of Stress : Not at all  Social Connections: Moderately Integrated (02/17/2023)   Social Connection and Isolation Panel [NHANES]    Frequency of Communication with Friends and Family: Once a week    Frequency of Social Gatherings with Friends and Family: Never    Attends Religious Services: More than 4 times per year    Active Member of [employer] or Organizations: Yes    Attends Engineer, structural: More than 4 times per year    Marital Status: Married  Catering manager Violence: Not At Risk (02/12/2022)   Received from [facility]   Humiliation, Afraid, Rape, and Kick questionnaire    Fear of Current or Ex-Partner: No    Emotionally Abused: No    Physically Abused: No    Sexually Abused: No   Family Status  Relation Name Status   Mother  Alive    Father  Alive   Sister  Alive   Son  Alive   Mat Aunt  (Not Specified)   Nurse, mental health  (Not Specified)   [employer]  (Not Specified)   MGM  Deceased   MGF  Deceased   PGM  Deceased   PGF  Deceased  No partnership data on file   Family History  Problem Relation Age of Onset   Hypertension Mother    Diabetes Mother    Hypertension Father    Hypertension Sister    Bipolar disorder Sister    Cancer Maternal Aunt    Diabetes Maternal Aunt  Diabetes Maternal Uncle    Hypertension Maternal Uncle    Parkinson's disease Maternal Uncle    Lymphoma Maternal Uncle    Thyroid disease Paternal Aunt    Osteoporosis Maternal Grandfather    Cancer Paternal Grandmother    Dementia Paternal Grandmother    Hypertension Paternal Grandfather    Allergies  Allergen Reactions   Sulfa Antibiotics Hives and Other (See Comments)      Patient Care Team: Sonny Masters, FNP as PCP - General (Family Medicine)   Outpatient Medications Prior to Visit  Medication Sig   fluticasone (FLONASE) 50 MCG/ACT nasal spray Place 2 sprays into both nostrils daily.   levonorgestrel-ethinyl estradiol (ALESSE) 0.1-20 MG-MCG tablet Take 1 tablet by mouth daily.   Multiple Vitamins-Minerals (MULTI-VITAMIN GUMMIES PO) Take by mouth.   pravastatin (PRAVACHOL) 10 MG tablet Take 1 tablet (10 mg total) by mouth daily.   [DISCONTINUED] hydrochlorothiazide (HYDRODIURIL) 25 MG tablet TAKE 1 TABLET (25 MG TOTAL) BY MOUTH DAILY.   [DISCONTINUED] Omega-3 Fatty Acids (FISH OIL) 1000 MG CPDR Take by mouth.   [DISCONTINUED] Red Yeast Rice Extract (RED YEAST RICE PO) Take by mouth.   No facility-administered medications prior to visit.    Review of Systems  Constitutional:  Negative for chills, diaphoresis, fever, malaise/fatigue and weight loss.  HENT: Negative.    Eyes: Negative.   Respiratory: Negative.    Cardiovascular: Negative.   Gastrointestinal: Negative.   Genitourinary: Negative.   Musculoskeletal: Negative.    Skin: Negative.   Neurological: Negative.   Endo/Heme/Allergies: Negative.   Psychiatric/Behavioral: Negative.    All other systems reviewed and are negative.         Objective:     BP 127/76   Pulse 82   Temp 97.7 F (36.5 C) (Temporal)   Ht 5' 7.5" (1.715 m)   Wt 223 lb (101.2 kg)   LMP 10/20/2023   SpO2 98%   BMI 34.41 kg/m  BP Readings from Last 3 Encounters:  10/20/23 127/76  02/18/23 130/65  12/25/22 129/79   Wt Readings from Last 3 Encounters:  10/20/23 223 lb (101.2 kg)  02/18/23 223 lb 6.4 oz (101.3 kg)  12/25/22 221 lb 8 oz (100.5 kg)   SpO2 Readings from Last 3 Encounters:  10/20/23 98%  02/18/23 100%  12/25/22 97%      Physical Exam Vitals and nursing note reviewed.  Constitutional:      General: She is not in acute distress.    Appearance: Normal appearance. She is well-developed and well-groomed. She is obese. She is not ill-appearing, toxic-appearing or diaphoretic.  HENT:     Head: Normocephalic and atraumatic.     Jaw: There is normal jaw occlusion.     Right Ear: Hearing, tympanic membrane, ear canal and external ear normal.     Left Ear: Hearing, tympanic membrane, ear canal and external ear normal.     Nose: Nose normal.     Mouth/Throat:     Lips: Pink.     Mouth: Mucous membranes are moist.     Pharynx: Oropharynx is clear. Uvula midline.  Eyes:     General: Lids are normal.     Extraocular Movements: Extraocular movements intact.     Conjunctiva/sclera: Conjunctivae normal.     Pupils: Pupils are equal, round, and reactive to light.  Neck:     Thyroid: No thyroid mass, thyromegaly or thyroid tenderness.     Vascular: No carotid bruit or JVD.     Trachea: Trachea and  phonation normal.  Cardiovascular:     Rate and Rhythm: Normal rate and regular rhythm.     Chest Wall: PMI is not displaced.     Pulses: Normal pulses.     Heart sounds: Normal heart sounds. No murmur heard.    No friction rub. No gallop.  Pulmonary:      Effort: Pulmonary effort is normal. No respiratory distress.     Breath sounds: Normal breath sounds. No wheezing.  Abdominal:     General: Bowel sounds are normal. There is no distension or abdominal bruit.     Palpations: Abdomen is soft. There is no hepatomegaly or splenomegaly.     Tenderness: There is no abdominal tenderness. There is no right CVA tenderness or left CVA tenderness.     Hernia: No hernia is present.  Genitourinary:    Comments: Sees GYN Musculoskeletal:        General: Normal range of motion.     Cervical back: Normal range of motion and neck supple.     Right lower leg: No edema.     Left lower leg: No edema.  Lymphadenopathy:     Cervical: No cervical adenopathy.  Skin:    General: Skin is warm and dry.     Capillary Refill: Capillary refill takes less than 2 seconds.     Coloration: Skin is not cyanotic, jaundiced or pale.     Findings: No rash.  Neurological:     General: No focal deficit present.     Mental Status: She is alert and oriented to person, place, and time.     Sensory: Sensation is intact.     Motor: Motor function is intact.     Coordination: Coordination is intact.     Gait: Gait is intact.     Deep Tendon Reflexes: Reflexes are normal and symmetric.  Psychiatric:        Attention and Perception: Attention and perception normal.        Mood and Affect: Mood and affect normal.        Speech: Speech normal.        Behavior: Behavior normal. Behavior is cooperative.        Thought Content: Thought content normal.        Cognition and Memory: Cognition and memory normal.        Judgment: Judgment normal.      Last CBC Lab Results  Component Value Date   WBC 6.2 02/18/2023   HGB 14.6 02/18/2023   HCT 41.4 02/18/2023   MCV 88 02/18/2023   MCH 31.0 02/18/2023   RDW 12.8 02/18/2023   PLT 359 02/18/2023   Last metabolic panel Lab Results  Component Value Date   GLUCOSE 80 02/18/2023   NA 139 02/18/2023   K 3.5 02/18/2023   CL 99  02/18/2023   CO2 23 02/18/2023   BUN 10 02/18/2023   CREATININE 0.76 02/18/2023   EGFR 102 02/18/2023   CALCIUM 9.7 02/18/2023   PROT 7.3 10/17/2022   ALBUMIN 4.5 10/17/2022   LABGLOB 2.8 10/17/2022   AGRATIO 1.6 10/17/2022   BILITOT 0.3 10/17/2022   ALKPHOS 55 10/17/2022   AST 20 10/17/2022   ALT 16 10/17/2022   Last lipids Lab Results  Component Value Date   CHOL 231 (H) 02/18/2023   HDL 44 02/18/2023   LDLCALC 149 (H) 02/18/2023   TRIG 210 (H) 02/18/2023   CHOLHDL 5.3 (H) 02/18/2023    Last thyroid functions Lab Results  Component  Value Date   TSH 1.900 02/18/2023   T4TOTAL 10.5 02/18/2023       Assessment & Plan:    Routine Health Maintenance and Physical Exam  Immunization History  Administered Date(s) Administered   Influenza Inj Mdck Quad Pf 08/28/2021   Influenza,inj,Quad PF,6+ Mos 09/30/2023   Influenza,inj,Quad PF,6-35 Mos 09/12/2022   Influenza,inj,quad, With Preservative 09/04/2018   Moderna Covid-19 Fall Seasonal Vaccine 59yrs & older 09/30/2023   Moderna SARS-COV2 Booster Vaccination 09/05/2021, 09/12/2022   Moderna Sars-Covid-2 Vaccination 02/20/2020, 03/20/2020, 10/06/2020   Tdap 09/27/2013, 10/16/2021    Health Maintenance  Topic Date Due   Hepatitis C Screening  10/19/2024 (Originally 08/16/2000)   HIV Screening  10/19/2024 (Originally 08/16/1997)   Cervical Cancer Screening (HPV/Pap Cotest)  02/12/2025   DTaP/Tdap/Td (3 - Td or Tdap) 10/17/2031   INFLUENZA VACCINE  Completed   COVID-19 Vaccine  Completed   HPV VACCINES  Aged Out    Discussed health benefits of physical activity, and encouraged her to engage in regular exercise appropriate for her age and condition.  Problem List Items Addressed This Visit       Cardiovascular and Mediastinum   Primary hypertension   Relevant Medications   hydrochlorothiazide (HYDRODIURIL) 25 MG tablet   Other Relevant Orders   Anemia Profile B   CMP14+EGFR   Lipid panel   Thyroid Panel With TSH    Other Visit Diagnoses     Annual physical exam    -  Primary   Relevant Orders   MM Digital Screening   Anemia Profile B   CMP14+EGFR   Lipid panel   Thyroid Panel With TSH   HIV Antibody (routine testing w rflx)   Hepatitis C antibody   Bayer DCA Hb A1c Waived   Encounter for screening mammogram for malignant neoplasm of breast       Relevant Orders   MM Digital Screening   Screening for deficiency anemia       Relevant Orders   Anemia Profile B   Screening for thyroid disorder       Relevant Orders   Thyroid Panel With TSH   Screening for lipid disorders       Relevant Orders   Lipid panel   Encounter for hepatitis C screening test for low risk patient       Relevant Orders   Hepatitis C antibody   Screening for HIV (human immunodeficiency virus)       Relevant Orders   HIV Antibody (routine testing w rflx)   Screening for diabetes mellitus       Relevant Orders   CMP14+EGFR   Bayer DCA Hb A1c Waived   Vitamin D deficiency       Relevant Orders   VITAMIN D 25 Hydroxy (Vit-D Deficiency, Fractures)      Return in about 1 year (around 10/19/2024), or if symptoms worsen or fail to improve, for CPE.     Kari Baars, FNP

## 2023-10-21 LAB — CMP14+EGFR
ALT: 17 [IU]/L (ref 0–32)
AST: 25 IU/L (ref 0–40)
Albumin: 4.3 g/dL (ref 3.9–4.9)
Alkaline Phosphatase: 69 IU/L (ref 44–121)
BUN/Creatinine Ratio: 12 (ref 9–23)
BUN: 9 mg/dL (ref 6–24)
Bilirubin Total: 0.5 mg/dL (ref 0.0–1.2)
CO2: 24 mmol/L (ref 20–29)
Calcium: 9.8 mg/dL (ref 8.7–10.2)
Chloride: 101 mmol/L (ref 96–106)
Creatinine, Ser: 0.75 mg/dL (ref 0.57–1.00)
Globulin, Total: 3.3 g/dL (ref 1.5–4.5)
Glucose: 82 mg/dL (ref 70–99)
Potassium: 3.3 mmol/L — ABNORMAL LOW (ref 3.5–5.2)
Sodium: 140 mmol/L (ref 134–144)
Total Protein: 7.6 g/dL (ref 6.0–8.5)
eGFR: 103 mL/min/{1.73_m2} (ref >59)

## 2023-10-21 LAB — ANEMIA PROFILE B
Basophils Absolute: 0.1 10*3/uL (ref 0.0–0.2)
Basos: 1 %
EOS (ABSOLUTE): 0.2 10*3/uL (ref 0.0–0.4)
Eos: 4 %
Ferritin: 64 ng/mL (ref 15–150)
Folate: >20 ng/mL (ref >3.0)
Hematocrit: 44.5 % (ref 34.0–46.6)
Hemoglobin: 14.7 g/dL (ref 11.1–15.9)
Immature Grans (Abs): 0 10*3/uL (ref 0.0–0.1)
Immature Granulocytes: 0 %
Iron Saturation: 27 % (ref 15–55)
Iron: 122 ug/dL (ref 27–159)
Lymphocytes Absolute: 2.9 10*3/uL (ref 0.7–3.1)
Lymphs: 51 %
MCH: 29.9 pg (ref 26.6–33.0)
MCHC: 33 g/dL (ref 31.5–35.7)
MCV: 90 fL (ref 79–97)
Monocytes Absolute: 0.5 10*3/uL (ref 0.1–0.9)
Monocytes: 8 %
Neutrophils Absolute: 2 10*3/uL (ref 1.4–7.0)
Neutrophils: 36 %
Platelets: 381 10*3/uL (ref 150–450)
RBC: 4.92 x10E6/uL (ref 3.77–5.28)
RDW: 12.4 % (ref 11.7–15.4)
Retic Ct Pct: 2.4 % (ref 0.6–2.6)
Total Iron Binding Capacity: 447 ug/dL (ref 250–450)
UIBC: 325 ug/dL (ref 131–425)
Vitamin B-12: 409 pg/mL (ref 232–1245)
WBC: 5.6 10*3/uL (ref 3.4–10.8)

## 2023-10-21 LAB — LIPID PANEL
Chol/HDL Ratio: 6 ratio — ABNORMAL HIGH (ref 0.0–4.4)
Cholesterol, Total: 259 mg/dL — ABNORMAL HIGH (ref 100–199)
HDL: 43 mg/dL (ref >39)
LDL Chol Calc (NIH): 178 mg/dL — ABNORMAL HIGH (ref 0–99)
Triglycerides: 202 mg/dL — ABNORMAL HIGH (ref 0–149)
VLDL Cholesterol Cal: 38 mg/dL (ref 5–40)

## 2023-10-21 LAB — THYROID PANEL WITH TSH
Free Thyroxine Index: 2.2 (ref 1.2–4.9)
T3 Uptake Ratio: 19 % — ABNORMAL LOW (ref 24–39)
T4, Total: 11.4 ug/dL (ref 4.5–12.0)
TSH: 1.7 u[IU]/mL (ref 0.450–4.500)

## 2023-10-21 LAB — VITAMIN D 25 HYDROXY (VIT D DEFICIENCY, FRACTURES): Vit D, 25-Hydroxy: 45.6 ng/mL (ref 30.0–100.0)

## 2023-10-21 LAB — HIV ANTIBODY (ROUTINE TESTING W REFLEX): HIV Screen 4th Generation wRfx: NONREACTIVE

## 2023-10-21 LAB — HEPATITIS C ANTIBODY: Hep C Virus Ab: NONREACTIVE

## 2024-01-11 LAB — HM MAMMOGRAPHY

## 2024-01-22 ENCOUNTER — Encounter: Payer: Self-pay | Admitting: Family Medicine

## 2024-01-31 ENCOUNTER — Other Ambulatory Visit: Payer: Self-pay | Admitting: Family Medicine

## 2024-01-31 DIAGNOSIS — E782 Mixed hyperlipidemia: Secondary | ICD-10-CM

## 2024-02-26 ENCOUNTER — Ambulatory Visit: Admitting: Family Medicine

## 2024-02-26 ENCOUNTER — Other Ambulatory Visit: Payer: Self-pay

## 2024-02-26 ENCOUNTER — Encounter: Payer: Self-pay | Admitting: Family Medicine

## 2024-02-26 ENCOUNTER — Inpatient Hospital Stay (HOSPITAL_COMMUNITY)
Admission: EM | Admit: 2024-02-26 | Discharge: 2024-02-29 | DRG: 419 | Disposition: A | Discharge status: Home / Self Care | Payer: Self-pay | Attending: Internal Medicine | Admitting: Internal Medicine

## 2024-02-26 ENCOUNTER — Emergency Department (HOSPITAL_COMMUNITY): Payer: Self-pay

## 2024-02-26 ENCOUNTER — Ambulatory Visit: Payer: Self-pay | Admitting: *Deleted

## 2024-02-26 VITALS — BP 130/82 | HR 107 | Temp 99.1°F | Ht 67.5 in | Wt 219.2 lb

## 2024-02-26 DIAGNOSIS — Z820 Family history of epilepsy and other diseases of the nervous system: Secondary | ICD-10-CM

## 2024-02-26 DIAGNOSIS — Z87891 Personal history of nicotine dependence: Secondary | ICD-10-CM

## 2024-02-26 DIAGNOSIS — I10 Essential (primary) hypertension: Secondary | ICD-10-CM

## 2024-02-26 DIAGNOSIS — Z833 Family history of diabetes mellitus: Secondary | ICD-10-CM

## 2024-02-26 DIAGNOSIS — Z8249 Family history of ischemic heart disease and other diseases of the circulatory system: Secondary | ICD-10-CM

## 2024-02-26 DIAGNOSIS — R17 Unspecified jaundice: Principal | ICD-10-CM

## 2024-02-26 DIAGNOSIS — R1013 Epigastric pain: Secondary | ICD-10-CM | POA: Diagnosis not present

## 2024-02-26 DIAGNOSIS — E785 Hyperlipidemia, unspecified: Secondary | ICD-10-CM

## 2024-02-26 DIAGNOSIS — R198 Other specified symptoms and signs involving the digestive system and abdomen: Secondary | ICD-10-CM

## 2024-02-26 DIAGNOSIS — R748 Abnormal levels of other serum enzymes: Secondary | ICD-10-CM | POA: Diagnosis present

## 2024-02-26 DIAGNOSIS — E876 Hypokalemia: Secondary | ICD-10-CM

## 2024-02-26 DIAGNOSIS — E669 Obesity, unspecified: Secondary | ICD-10-CM | POA: Diagnosis present

## 2024-02-26 DIAGNOSIS — R822 Biliuria: Secondary | ICD-10-CM

## 2024-02-26 DIAGNOSIS — Z6834 Body mass index (BMI) 34.0-34.9, adult: Secondary | ICD-10-CM

## 2024-02-26 DIAGNOSIS — Z818 Family history of other mental and behavioral disorders: Secondary | ICD-10-CM

## 2024-02-26 DIAGNOSIS — Z807 Family history of other malignant neoplasms of lymphoid, hematopoietic and related tissues: Secondary | ICD-10-CM

## 2024-02-26 DIAGNOSIS — R1114 Bilious vomiting: Secondary | ICD-10-CM

## 2024-02-26 DIAGNOSIS — Z79899 Other long term (current) drug therapy: Secondary | ICD-10-CM

## 2024-02-26 DIAGNOSIS — K8065 Calculus of gallbladder and bile duct with chronic cholecystitis with obstruction: Principal | ICD-10-CM | POA: Diagnosis present

## 2024-02-26 DIAGNOSIS — Z882 Allergy status to sulfonamides status: Secondary | ICD-10-CM

## 2024-02-26 DIAGNOSIS — R000 Tachycardia, unspecified: Secondary | ICD-10-CM

## 2024-02-26 DIAGNOSIS — R8271 Bacteriuria: Secondary | ICD-10-CM

## 2024-02-26 DIAGNOSIS — K760 Fatty (change of) liver, not elsewhere classified: Secondary | ICD-10-CM | POA: Diagnosis present

## 2024-02-26 HISTORY — DX: Essential (primary) hypertension: I10

## 2024-02-26 HISTORY — DX: Hyperlipidemia, unspecified: E78.5

## 2024-02-26 LAB — COMPREHENSIVE METABOLIC PANEL WITH GFR
ALT: 243 U/L — ABNORMAL HIGH (ref 0–44)
AST: 159 U/L — ABNORMAL HIGH (ref 15–41)
Albumin: 4.1 g/dL (ref 3.5–5.0)
Alkaline Phosphatase: 122 U/L (ref 38–126)
Anion gap: 16 — ABNORMAL HIGH (ref 5–15)
BUN: 7 mg/dL (ref 6–20)
CO2: 22 mmol/L (ref 22–32)
Calcium: 9.5 mg/dL (ref 8.9–10.3)
Chloride: 99 mmol/L (ref 98–111)
Creatinine, Ser: 0.87 mg/dL (ref 0.44–1.00)
GFR, Estimated: >60 mL/min (ref >60)
Glucose, Bld: 125 mg/dL — ABNORMAL HIGH (ref 70–99)
Potassium: 2.8 mmol/L — ABNORMAL LOW (ref 3.5–5.1)
Sodium: 137 mmol/L (ref 135–145)
Total Bilirubin: 8.5 mg/dL — ABNORMAL HIGH (ref 0.0–1.2)
Total Protein: 8.2 g/dL — ABNORMAL HIGH (ref 6.5–8.1)

## 2024-02-26 LAB — CBC
HCT: 45.3 % (ref 36.0–46.0)
Hemoglobin: 15.4 g/dL — ABNORMAL HIGH (ref 12.0–15.0)
MCH: 30 pg (ref 26.0–34.0)
MCHC: 34 g/dL (ref 30.0–36.0)
MCV: 88.3 fL (ref 80.0–100.0)
Platelets: 410 10*3/uL — ABNORMAL HIGH (ref 150–400)
RBC: 5.13 MIL/uL — ABNORMAL HIGH (ref 3.87–5.11)
RDW: 13.2 % (ref 11.5–15.5)
WBC: 10.2 10*3/uL (ref 4.0–10.5)
nRBC: 0 % (ref 0.0–0.2)

## 2024-02-26 LAB — MICROSCOPIC EXAMINATION
RBC, Urine: NONE SEEN /HPF (ref 0–2)
Renal Epithel, UA: NONE SEEN /HPF
Yeast, UA: NONE SEEN

## 2024-02-26 LAB — BILIRUBIN, FRACTIONATED(TOT/DIR/INDIR)
Bilirubin, Direct: 5.2 mg/dL — ABNORMAL HIGH (ref 0.0–0.2)
Indirect Bilirubin: 3.2 mg/dL — ABNORMAL HIGH (ref 0.3–0.9)
Total Bilirubin: 8.4 mg/dL — ABNORMAL HIGH (ref 0.0–1.2)

## 2024-02-26 LAB — URINALYSIS, ROUTINE W REFLEX MICROSCOPIC
Bilirubin, UA: POSITIVE — AB
Glucose, UA: NEGATIVE mg/dL
Hgb urine dipstick: NEGATIVE
Ketones, ur: 20 mg/dL — AB
Nitrite, UA: NEGATIVE
Nitrite: NEGATIVE
Protein, ur: 30 mg/dL — AB
RBC, UA: NEGATIVE
Specific Gravity, UA: 1.025 (ref 1.005–1.030)
Specific Gravity, Urine: 1.021 (ref 1.005–1.030)
Squamous Epithelial / HPF: >50 /HPF (ref 0–5)
Urobilinogen, Ur: 1 mg/dL (ref 0.2–1.0)
WBC, UA: >50 WBC/hpf (ref 0–5)
pH, UA: 6 (ref 5.0–7.5)
pH: 5 (ref 5.0–8.0)

## 2024-02-26 LAB — MONONUCLEOSIS SCREEN: Mono Screen: NEGATIVE

## 2024-02-26 LAB — LIPASE, BLOOD: Lipase: 27 U/L (ref 11–51)

## 2024-02-26 LAB — HCG, SERUM, QUALITATIVE: Preg, Serum: NEGATIVE

## 2024-02-26 LAB — MAGNESIUM: Magnesium: 2.4 mg/dL (ref 1.7–2.4)

## 2024-02-26 MED ORDER — POTASSIUM CHLORIDE 10 MEQ/100ML IV SOLN
10.0000 meq | INTRAVENOUS | Status: AC
  Administered 2024-02-26 – 2024-02-27 (×3): 10 meq via INTRAVENOUS
  Filled 2024-02-26 (×3): qty 100

## 2024-02-26 MED ORDER — LACTATED RINGERS IV BOLUS
1000.0000 mL | Freq: Once | INTRAVENOUS | Status: AC
  Administered 2024-02-26: 1000 mL via INTRAVENOUS

## 2024-02-26 NOTE — Discharge Instructions (Addendum)

## 2024-02-26 NOTE — Telephone Encounter (Signed)
  Chief Complaint: abdominal pain , yellowing of eyes, dark colored urine Symptoms: abdominal pain, under rib area, dark colored urine and whites of eyes look yellowish today . Vomited x 1 last night . Stool color yellowish, took pepto bismol without relief. Drinking more water today  Frequency: 3 days ago for abdominal pain , 2-3 days dark urine, yellow eyes today  Pertinent Negatives: Patient denies chest pain no difficulty breathing no fever no severe pain  Disposition: [] ED /[] Urgent Care (no appt availability in office) / [x] Appointment(In office/virtual)/ []  [facility]/ [] Home Care/ [] Refused Recommended Disposition /[]  Mobile Bus/ []  Follow-up with PCP Additional Notes:   No appt with PCP. Appt scheduled today with other provider in practice.       Reason for Disposition  White of the eyes have turned yellow (i.e., jaundice)  Answer Assessment - Initial Assessment Questions 1. LOCATION: "Where does it hurt?"      Under rib area in front of abdomen 2. RADIATION: "Does the pain shoot anywhere else?" (e.g., chest, back)     Na  3. ONSET: "When did the pain begin?" (e.g., minutes, hours or days ago)      3 days ago 4. SUDDEN: "Gradual or sudden onset?"     N a 5. PATTERN "Does the pain come and go, or is it constant?"    - If it comes and goes: "How long does it last?" "Do you have pain now?"     (Note: Comes and goes means the pain is intermittent. It goes away completely between bouts.)    - If constant: "Is it getting better, staying the same, or getting worse?"      (Note: Constant means the pain never goes away completely; most serious pain is constant and gets worse.)      Bouts of pain mild now  6. SEVERITY: "How bad is the pain?"  (e.g., Scale 1-10; mild, moderate, or severe)    - MILD (1-3): Doesn't interfere with normal activities, abdomen soft and not tender to touch.     - MODERATE (4-7): Interferes with normal activities or awakens from sleep,  abdomen tender to touch.     - SEVERE (8-10): Excruciating pain, doubled over, unable to do any normal activities.       Mild pain but does not sleep/rest well 7. RECURRENT SYMPTOM: "Have you ever had this type of stomach pain before?" If Yes, ask: "When was the last time?" and "What happened that time?"      Yes greater than month ago and sx went away  8. CAUSE: "What do you think is causing the stomach pain?"     Not sure possible medication  9. RELIEVING/AGGRAVATING FACTORS: "What makes it better or worse?" (e.g., antacids, bending or twisting motion, bowel movement)     Nothing  10. OTHER SYMPTOMS: "Do you have any other symptoms?" (e.g., back pain, diarrhea, fever, urination pain, vomiting)       Abdominal pain under rib area, vomited  last night and felt better, stool solid and yellow in color. Urine dark in color, whites of eyes yellow today  11. PREGNANCY: "Is there any chance you are pregnant?" "When was your last menstrual period?"       na  Protocols used: Abdominal Pain - [facility]

## 2024-02-26 NOTE — ED Provider Notes (Signed)
 [place] EM[facility]GENC[facility] DEPARTMENT AT [facility] Provider Note   CSN: [id number] Arrival date & time: 02/26/24  1716     History Chief Complaint  Patient presents with   Jaundice         HPI Kylie Cook is a 43 y.o. female presenting for skin discoloration. She is a 43 year old female with a relatively minimal medical history.  States that she has had 3 days of right upper quadrant pain, nausea vomiting all of which has now resolved.  However she noticed jaundice in her eyes earlier today went to her PCP had an elevated bilirubin in her urine and was told to come to the ER for further care and management. She is ambulatory tolerating p.o. intake on arrival.  States nausea and vomiting have resolved. RU quadrant pain completely resolved at this time.   Patient's recorded medical, surgical, social, medication list and allergies were reviewed in the Snapshot window as part of the initial history.   Review of Systems   Review of Systems  Constitutional:  Negative for chills and fever.  HENT:  Negative for ear pain and sore throat.   Eyes:  Negative for pain and visual disturbance.  Respiratory:  Negative for cough and shortness of breath.   Cardiovascular:  Negative for chest pain and palpitations.  Gastrointestinal:  Positive for abdominal pain, nausea and vomiting. Negative for diarrhea.  Genitourinary:  Negative for dysuria and hematuria.  Musculoskeletal:  Negative for arthralgias and back pain.  Skin:  Negative for color change and rash.  Neurological:  Negative for seizures and syncope.  All other systems reviewed and are negative.   Physical Exam Updated Vital Signs BP (!) 143/88   Pulse 85   Temp 98.4 F (36.9 C)   Resp 17   Ht 5' 7.5" (1.715 m)   Wt 99.3 kg   LMP 02/08/2024   SpO2 99%   BMI 33.79 kg/m  Physical Exam Vitals and nursing note reviewed.  Constitutional:      General: She is not in acute distress.    Appearance: She is  well-developed.  HENT:     Head: Normocephalic and atraumatic.  Eyes:     Conjunctiva/sclera: Conjunctivae normal.  Cardiovascular:     Rate and Rhythm: Normal rate and regular rhythm.     Heart sounds: No murmur heard. Pulmonary:     Effort: Pulmonary effort is normal. No respiratory distress.     Breath sounds: Normal breath sounds.  Abdominal:     General: There is no distension.     Palpations: Abdomen is soft.     Tenderness: There is no abdominal tenderness. There is no right CVA tenderness or left CVA tenderness.  Musculoskeletal:        General: No swelling or tenderness. Normal range of motion.     Cervical back: Neck supple.  Skin:    General: Skin is warm and dry.     Coloration: Skin is jaundiced.  Neurological:     General: No focal deficit present.     Mental Status: She is alert and oriented to person, place, and time. Mental status is at baseline.     Cranial Nerves: No cranial nerve deficit.      ED Course/ Medical Decision Making/ A&P    Procedures Procedures   Medications Ordered in ED Medications  potassium chloride 10 mEq in 100 mL IVPB (10 mEq Intravenous New Bag/Given 02/26/24 2229)  lactated ringers bolus 1,000 mL (1,000 mLs Intravenous New Bag/Given  02/26/24 2229)    Medical Decision Making:   This a 43 year old female presented with intermittent right upper quadrant pain, jaundice, intermittent nausea vomiting. Lab work was all performed and demonstrates a bilirubin of 8.5 which fractionated to direct of 5.2. Right upper quadrant ultrasound shows no focal pathology though she does have cholelithiasis. Alk phos was negative for acute pathology. 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 Notably she does not use Tylenol, does not drink alcohol frequently and does not use any supplements  or other substances. Biliary pathology seems less likely given the negative right upper quadrant ultrasound but she did have cholelithiasis so it may have been a stone that is now clinically not visible or stuck in the bile duct though I would expect an alkaline phosphatase elevation. Will consult with gastroenterology for further recommendations. Reassessment and Plan:   Gastroenterology recommended repeating the labs in the morning, hydrating overnight.  They stated they would decide on further imaging in AM.   Disposition:   Based on the above findings, I believe this patient is stable for admission.    Patient/family educated about specific findings on our evaluation and explained exact reasons for admission.  Patient/family educated about clinical situation and time was allowed to answer questions.   Admission team communicated with and agreed with need for admission. Patient admitted. Patient ready to move at this time.     Emergency Department Medication Summary:   Medications  potassium chloride 10 mEq in 100 mL IVPB (10 mEq Intravenous New Bag/Given 02/26/24 2336)  lactated ringers bolus 1,000 mL (1,000 mLs Intravenous New Bag/Given 02/26/24 2229)         Clinical Impression:  1. Jaundice      Admit   Final Clinical Impression(s) / ED Diagnoses Final diagnoses:  Jaundice    Rx / DC Orders ED Discharge Orders     None         Glyn Ade, MD 02/27/24 0009

## 2024-02-26 NOTE — ED Triage Notes (Signed)
 Pt presents with a 3 day history of intermittent epigastric pain that worsens 2 hours after eating a meal. Today pt noticed that she appeared jaundice and she saw her PCP. They noted high bilirubin levels in her urine and referred her to the ED.

## 2024-02-26 NOTE — ED Notes (Signed)
 Pt in [place]

## 2024-02-26 NOTE — Progress Notes (Signed)
 Acute Office Visit  Subjective:     Patient ID: Kylie Cook, female    DOB: Aug 02, 1981, 43 y.o.   MRN: [id number]  Chief Complaint  Patient presents with   dark urine     X 2-3 day    Jaundice    Yellowing of bilateral eyes that was noticed lastnight    Stool Color Change    Patient states this morning her stool was pale and yellow.    Abdominal Pain    X 2-3 days.  States she vomited twice last night.    Abdominal Pain This is a new problem. Episode onset: 3 days. The onset quality is gradual. The problem has been waxing and waning. The pain is located in the epigastric region. The quality of the pain is a sensation of fullness. The abdominal pain radiates to the back. Associated symptoms include anorexia, nausea and vomiting (x2 yesterday- stomach contents then stomach acid). Pertinent negatives include no belching, constipation, diarrhea, dysuria, fever, flatus, frequency or hematuria. Associated symptoms comments: Dark urine, yellowing of eyes yesterday. The pain is aggravated by eating. The pain is relieved by Nothing. Treatments tried: giner ale, pepto. The treatment provided no relief.  Also reports light colored stool this morning.   Reports that she has had milder similar pain intermittently over the last few months but that it has been milder and hasn't lasted this long.   Review of Systems  Constitutional:  Negative for fever.  Gastrointestinal:  Positive for abdominal pain, anorexia, nausea and vomiting (x2 yesterday- stomach contents then stomach acid). Negative for constipation, diarrhea and flatus.  Genitourinary:  Negative for dysuria, frequency and hematuria.        Objective:    BP 130/82   Pulse (!) 107   Temp 99.1 F (37.3 C)   Ht 5' 7.5" (1.715 m)   Wt 219 lb 3.2 oz (99.4 kg)   LMP 02/08/2024   SpO2 99%   BMI 33.82 kg/m  Wt Readings from Last 3 Encounters:  02/26/24 219 lb 3.2 oz (99.4 kg)  10/20/23 223 lb (101.2 kg)  02/18/23 223 lb 6.4 oz  (101.3 kg)      Pulse Readings from Last 3 Encounters:  02/26/24 (!) 107  10/20/23 82  02/18/23 68    Physical Exam Vitals and nursing note reviewed.  Constitutional:      General: She is not in acute distress.    Appearance: She is not ill-appearing, toxic-appearing or diaphoretic.  Eyes:     General: Scleral icterus present.  Cardiovascular:     Rate and Rhythm: Normal rate and regular rhythm.     Heart sounds: Normal heart sounds. No murmur heard. Pulmonary:     Effort: Pulmonary effort is normal. No respiratory distress.     Breath sounds: Normal breath sounds. No wheezing, rhonchi or rales.  Abdominal:     General: Bowel sounds are normal. There is no distension.     Palpations: Abdomen is soft.     Tenderness: There is abdominal tenderness in the right upper quadrant and epigastric area. There is no guarding or rebound. Positive signs include Murphy's sign.  Skin:    General: Skin is warm and dry.     Coloration: Skin is not jaundiced.  Neurological:     General: No focal deficit present.     Mental Status: She is alert and oriented to person, place, and time.  Psychiatric:        Mood and Affect: Mood normal.  Behavior: Behavior normal.     Urine dipstick shows positive for glucose, positive for leukocytes, positive for urobilinogen, and positive for ketones.  Micro exam: 0-5 WBC's per HPF, 0 RBC's per HPF, and few + bacteria.       Assessment & Plan:   Kylie Cook was seen today for dark urine , jaundice, stool color change and abdominal pain.  Diagnoses and all orders for this visit:  Jaundice  Bilirubin in urine -     Urinalysis, Routine w reflex microscopic -     Urine Culture  Epigastric pain  Bilious vomiting with nausea  Positive Murphy's Sign  Tachycardia   Suspect acute cholecystitis with obstruction. Discussed with the patient the need for emergent evaluation and treatment. Patient to proceed to ER. She agrees with plan.   Total  time spent caring for the patient today was 43 minutes. This includes time spent before the visit reviewing the chart, time spent during the visit, and time spent after the visit on documentation.   Return for Go to ER for evaluation of gallbladder, jaundice.  The patient indicates understanding of these issues and agrees with the plan.  Gabriel Earing, FNP

## 2024-02-26 NOTE — Telephone Encounter (Signed)
 Appt today

## 2024-02-27 ENCOUNTER — Observation Stay (HOSPITAL_COMMUNITY)

## 2024-02-27 ENCOUNTER — Encounter (HOSPITAL_COMMUNITY): Payer: Self-pay | Admitting: Internal Medicine

## 2024-02-27 DIAGNOSIS — R748 Abnormal levels of other serum enzymes: Secondary | ICD-10-CM

## 2024-02-27 DIAGNOSIS — K8060 Calculus of gallbladder and bile duct with cholecystitis, unspecified, without obstruction: Secondary | ICD-10-CM | POA: Diagnosis not present

## 2024-02-27 DIAGNOSIS — R8271 Bacteriuria: Secondary | ICD-10-CM | POA: Diagnosis present

## 2024-02-27 DIAGNOSIS — E876 Hypokalemia: Secondary | ICD-10-CM | POA: Diagnosis present

## 2024-02-27 DIAGNOSIS — Z8249 Family history of ischemic heart disease and other diseases of the circulatory system: Secondary | ICD-10-CM | POA: Diagnosis not present

## 2024-02-27 DIAGNOSIS — E669 Obesity, unspecified: Secondary | ICD-10-CM | POA: Diagnosis present

## 2024-02-27 DIAGNOSIS — Z818 Family history of other mental and behavioral disorders: Secondary | ICD-10-CM | POA: Diagnosis not present

## 2024-02-27 DIAGNOSIS — I10 Essential (primary) hypertension: Secondary | ICD-10-CM

## 2024-02-27 DIAGNOSIS — Z807 Family history of other malignant neoplasms of lymphoid, hematopoietic and related tissues: Secondary | ICD-10-CM | POA: Diagnosis not present

## 2024-02-27 DIAGNOSIS — Z6834 Body mass index (BMI) 34.0-34.9, adult: Secondary | ICD-10-CM | POA: Diagnosis not present

## 2024-02-27 DIAGNOSIS — Z87891 Personal history of nicotine dependence: Secondary | ICD-10-CM | POA: Diagnosis not present

## 2024-02-27 DIAGNOSIS — K8065 Calculus of gallbladder and bile duct with chronic cholecystitis with obstruction: Secondary | ICD-10-CM | POA: Diagnosis present

## 2024-02-27 DIAGNOSIS — Z882 Allergy status to sulfonamides status: Secondary | ICD-10-CM | POA: Diagnosis not present

## 2024-02-27 DIAGNOSIS — R17 Unspecified jaundice: Secondary | ICD-10-CM | POA: Diagnosis present

## 2024-02-27 DIAGNOSIS — Z833 Family history of diabetes mellitus: Secondary | ICD-10-CM | POA: Diagnosis not present

## 2024-02-27 DIAGNOSIS — Z820 Family history of epilepsy and other diseases of the nervous system: Secondary | ICD-10-CM | POA: Diagnosis not present

## 2024-02-27 DIAGNOSIS — K760 Fatty (change of) liver, not elsewhere classified: Secondary | ICD-10-CM | POA: Diagnosis present

## 2024-02-27 DIAGNOSIS — Z79899 Other long term (current) drug therapy: Secondary | ICD-10-CM | POA: Diagnosis not present

## 2024-02-27 DIAGNOSIS — E785 Hyperlipidemia, unspecified: Secondary | ICD-10-CM | POA: Diagnosis present

## 2024-02-27 LAB — HEPATITIS PANEL, ACUTE
HCV Ab: NONREACTIVE
Hep A IgM: NONREACTIVE
Hep B C IgM: NONREACTIVE
Hepatitis B Surface Ag: NONREACTIVE

## 2024-02-27 LAB — COMPREHENSIVE METABOLIC PANEL WITH GFR
ALT: 204 U/L — ABNORMAL HIGH (ref 0–44)
AST: 122 U/L — ABNORMAL HIGH (ref 15–41)
Albumin: 3.4 g/dL — ABNORMAL LOW (ref 3.5–5.0)
Alkaline Phosphatase: 102 U/L (ref 38–126)
Anion gap: 9 (ref 5–15)
BUN: <5 mg/dL — ABNORMAL LOW (ref 6–20)
CO2: 27 mmol/L (ref 22–32)
Calcium: 8.9 mg/dL (ref 8.9–10.3)
Chloride: 102 mmol/L (ref 98–111)
Creatinine, Ser: 0.66 mg/dL (ref 0.44–1.00)
GFR, Estimated: >60 mL/min (ref >60)
Glucose, Bld: 88 mg/dL (ref 70–99)
Potassium: 3 mmol/L — ABNORMAL LOW (ref 3.5–5.1)
Sodium: 138 mmol/L (ref 135–145)
Total Bilirubin: 3.6 mg/dL — ABNORMAL HIGH (ref 0.0–1.2)
Total Protein: 7.1 g/dL (ref 6.5–8.1)

## 2024-02-27 LAB — ACETAMINOPHEN LEVEL: Acetaminophen (Tylenol), Serum: <10 ug/mL — ABNORMAL LOW (ref 10–30)

## 2024-02-27 LAB — URINE CULTURE

## 2024-02-27 MED ORDER — SODIUM CHLORIDE 0.9 % IV SOLN: INTRAVENOUS | Status: AC

## 2024-02-27 MED ORDER — POTASSIUM CHLORIDE CRYS ER 20 MEQ PO TBCR
40.0000 meq | EXTENDED_RELEASE_TABLET | Freq: Once | ORAL | Status: DC
Start: 2024-02-27 — End: 2024-02-28

## 2024-02-27 MED ORDER — HYDRALAZINE HCL 20 MG/ML IJ SOLN: 5.0000 mg | INTRAMUSCULAR | Status: DC | PRN

## 2024-02-27 MED ORDER — LEVONORGESTREL-ETHINYL ESTRAD 0.1-20 MG-MCG PO TABS: 1.0000 | ORAL_TABLET | Freq: Every day | ORAL | Status: DC

## 2024-02-27 MED ORDER — HYDROCHLOROTHIAZIDE 25 MG PO TABS
25.0000 mg | ORAL_TABLET | Freq: Every day | ORAL | Status: DC
  Administered 2024-02-27 – 2024-02-29 (×3): 25 mg via ORAL
  Filled 2024-02-27 (×3): qty 1

## 2024-02-27 MED ORDER — HYDROCHLOROTHIAZIDE 25 MG PO TABS: 25.0000 mg | ORAL_TABLET | Freq: Every day | ORAL | Status: DC

## 2024-02-27 MED ORDER — GADOBUTROL 1 MMOL/ML IV SOLN
10.0000 mL | Freq: Once | INTRAVENOUS | Status: AC | PRN
  Administered 2024-02-27: 10 mL via INTRAVENOUS

## 2024-02-27 MED ORDER — POTASSIUM CHLORIDE 10 MEQ/100ML IV SOLN
10.0000 meq | INTRAVENOUS | Status: AC
  Administered 2024-02-27 (×4): 10 meq via INTRAVENOUS
  Filled 2024-02-27 (×4): qty 100

## 2024-02-27 MED ORDER — LEVONORGESTREL-ETHINYL ESTRAD 0.1-20 MG-MCG PO TABS
1.0000 | ORAL_TABLET | Freq: Every day | ORAL | Status: DC
  Administered 2024-02-28: 1 via ORAL

## 2024-02-27 NOTE — Hospital Course (Addendum)
 Taken from H&P.  Kylie Cook is a 43 y.o. female with medical history significant of hypertension, hyperlipidemia, obesity (BMI 33.79) sent to the ED by PCP for evaluation of epigastric abdominal pain, nausea, vomiting, jaundice, and bilirubin in her urine.    On presentation hemodynamically stable, Labs showing no leukocytosis, potassium 2.8, magnesium 2.4, AST 159, ALT 243, alk phos 122, T. bili 8.5, lipase normal, serum hCG negative, acute hepatitis panel negative, mononucleosis screen negative.  UA with negative nitrite, small amount of leukocytes, and microscopy showing 6-10 RBCs, >50 WBCs, and rare bacteria.  Right upper quadrant abdominal ultrasound showing cholelithiasis, hepatic steatosis, and a 4.7 mm gallbladder polyp which radiologist thinks is likely benign. ED physician consulted GI (Dr. Elnoria Howard) who recommended IV fluid hydration overnight and repeating labs in the morning.   3/29: Vital stable, improving liver function with significant improvement in T. bili to 3.6.  GI ordered MRCP and if that cannot be done due to heart failure and left ankle then they will do ERCP tomorrow. Likely obstructive jaundice and stone might have passed.

## 2024-02-27 NOTE — Plan of Care (Signed)

## 2024-02-27 NOTE — Plan of Care (Signed)
   Problem: Education: Goal: Knowledge of General Education information will improve Description: Including pain rating scale, medication(s)/side effects and non-pharmacologic comfort measures Outcome: Completed/Met

## 2024-02-27 NOTE — H&P (Signed)
 History and Physical    Ty Oshima AVW:[id number] DOB: 1981-10-25 DOA: 02/26/2024  PCP: Sonny Masters, FNP  Patient coming from: Home  Chief Complaint: Abdominal pain, jaundice  HPI: Kylie Cook is a 43 y.o. female with medical history significant of hypertension, hyperlipidemia, obesity (BMI 33.79) sent to the ED by PCP for evaluation of epigastric abdominal pain, nausea, vomiting, jaundice, and bilirubin in her urine.  Patient's abdominal pain and nausea/vomiting had resolved upon arrival to the ED.  Afebrile.  Labs showing no leukocytosis, potassium 2.8, magnesium 2.4, AST 159, ALT 243, alk phos 122, T. bili 8.5, lipase normal, serum hCG negative, acute hepatitis panel negative, mononucleosis screen negative.  UA with negative nitrite, small amount of leukocytes, and microscopy showing 6-10 RBCs, >50 WBCs, and rare bacteria.  Right upper quadrant abdominal ultrasound showing cholelithiasis, hepatic steatosis, and a 4.7 mm gallbladder polyp which radiologist thinks is likely benign. ED physician consulted GI (Dr. Elnoria Howard) who recommended IV fluid hydration overnight and repeating labs in the morning.  GI will consult in the morning and decide whether MRCP needs to be ordered to rule out choledocholithiasis.  Patient was given 1 L LR and IV potassium 10 mEq x 3 in the ED.  [facility] called to admit.  Patient is reporting 2-day history of jaundice/scleral icterus, dark urine, epigastric abdominal pain, nausea, and vomiting.  She is no longer having any abdominal pain and no longer having any nausea or vomiting.  Denies fevers or chills.  No recent medication changes reported.  Denies shortness of breath or chest pain.  Review of Systems:  Review of Systems  All other systems reviewed and are negative.   Past Medical History:  Diagnosis Date   Closed left ankle fracture    Complication of anesthesia    Dermatographia    PONV (postoperative nausea and vomiting)     Past Surgical History:   Procedure Laterality Date   CERVICAL CONE BIOPSY     x2   ORIF ANKLE FRACTURE Left 03/22/2019   Procedure: Open treatment of left ankle trimalleolar fracture and syndesmosis injury with internal fixation;  Surgeon: Toni Arthurs, MD;  Location: [facility];  Service: Orthopedics;  Laterality: Left;    WISDOM TOOTH EXTRACTION       reports that she quit smoking about 21 years ago. Her smoking use included cigarettes. She has never used smokeless tobacco. She reports current alcohol use. She reports that she does not use drugs.  Allergies  Allergen Reactions   Sulfa Antibiotics Hives and Other (See Comments)    Family History  Problem Relation Age of Onset   Hypertension Mother    Diabetes Mother    Hypertension Father    Hypertension Sister    Bipolar disorder Sister    Cancer Maternal Aunt    Diabetes Maternal Aunt    Diabetes Maternal Uncle    Hypertension Maternal Uncle    Parkinson's disease Maternal Uncle    Lymphoma Maternal Uncle    Thyroid disease Paternal Aunt    Osteoporosis Maternal Grandfather    Cancer Paternal Grandmother    Dementia Paternal Grandmother    Hypertension Paternal Grandfather     Prior to Admission medications   Medication Sig Start Date End Date Taking? Authorizing Provider  hydrochlorothiazide (HYDRODIURIL) 25 MG tablet Take 1 tablet (25 mg total) by mouth daily. 10/20/23  Yes Rakes, Doralee Albino, FNP  levonorgestrel-ethinyl estradiol (ALESSE) 0.1-20 MG-MCG tablet Take 1 tablet by mouth daily.   Yes [provider]  Multiple Vitamins-Minerals (MULTI-VITAMIN GUMMIES PO) Take 2 tablets by mouth daily.   Yes [provider]  pravastatin (PRAVACHOL) 10 MG tablet TAKE 1 TABLET BY MOUTH EVERY DAY 02/01/24  Yes Sonny Masters, FNP    Physical Exam: Vitals:   02/26/24 2230 02/26/24 2339 02/27/24 0000 02/27/24 0100  BP: (!) 143/88  130/74 128/79  Pulse: 85  66 82  Resp: 17   18  Temp:  98.4 F (36.9 C)  97.7 F  (36.5 C)  TempSrc:  Oral  Oral  SpO2: 99%  100% 100%  Weight:      Height:        Physical Exam Vitals reviewed.  Constitutional:      General: She is not in acute distress. HENT:     Head: Normocephalic and atraumatic.  Eyes:     General: Scleral icterus present.     Extraocular Movements: Extraocular movements intact.  Cardiovascular:     Rate and Rhythm: Normal rate and regular rhythm.     Pulses: Normal pulses.  Pulmonary:     Effort: Pulmonary effort is normal. No respiratory distress.     Breath sounds: Normal breath sounds. No wheezing or rales.  Abdominal:     General: Bowel sounds are normal. There is no distension.     Palpations: Abdomen is soft.     Tenderness: There is no abdominal tenderness. There is no guarding or rebound.  Musculoskeletal:     Cervical back: Normal range of motion.     Right lower leg: No edema.     Left lower leg: No edema.  Skin:    General: Skin is warm and dry.     Coloration: Skin is jaundiced.  Neurological:     General: No focal deficit present.     Mental Status: She is alert and oriented to person, place, and time.     Labs on Admission: I have personally reviewed following labs and imaging studies  CBC: Recent Labs  Lab 02/26/24 1749  WBC 10.2  HGB 15.4*  HCT 45.3  MCV 88.3  PLT 410*   Basic Metabolic Panel: Recent Labs  Lab 02/26/24 1749 02/26/24 2217  NA 137  --   K 2.8*  --   CL 99  --   CO2 22  --   GLUCOSE 125*  --   BUN 7  --   CREATININE 0.87  --   CALCIUM 9.5  --   MG  --  2.4   GFR: Estimated Creatinine Clearance: 104 mL/min (by C-G formula based on SCr of 0.87 mg/dL). Liver Function Tests: Recent Labs  Lab 02/26/24 1749  AST 159*  ALT 243*  ALKPHOS 122  BILITOT 8.4*  8.5*  PROT 8.2*  ALBUMIN 4.1   Recent Labs  Lab 02/26/24 1749  LIPASE 27   No results for input(s): "AMMONIA" in the last 168 hours. Coagulation Profile: No results for input(s): "INR", "PROTIME" in the last 168  hours. Cardiac Enzymes: No results for input(s): "CKTOTAL", "CKMB", "CKMBINDEX", "TROPONINI" in the last 168 hours. BNP (last 3 results) No results for input(s): "PROBNP" in the last 8760 hours. HbA1C: No results for input(s): "HGBA1C" in the last 72 hours. CBG: No results for input(s): "GLUCAP" in the last 168 hours. Lipid Profile: No results for input(s): "CHOL", "HDL", "LDLCALC", "TRIG", "CHOLHDL", "LDLDIRECT" in the last 72 hours. Thyroid Function Tests: No results for input(s): "TSH", "T4TOTAL", "FREET4", "T3FREE", "THYROIDAB" in the last 72 hours. Anemia Panel:  No results for input(s): "VITAMINB12", "FOLATE", "FERRITIN", "TIBC", "IRON", "RETICCTPCT" in the last 72 hours. Urine analysis:    Component Value Date/Time   COLORURINE AMBER (A) 02/26/2024 1749   APPEARANCEUR CLOUDY (A) 02/26/2024 1749   APPEARANCEUR Clear 02/26/2024 1451   LABSPEC 1.021 02/26/2024 1749   PHURINE 5.0 02/26/2024 1749   GLUCOSEU NEGATIVE 02/26/2024 1749   HGBUR NEGATIVE 02/26/2024 1749   BILIRUBINUR MODERATE (A) 02/26/2024 1749   BILIRUBINUR Positive (A) 02/26/2024 1451   KETONESUR 20 (A) 02/26/2024 1749   PROTEINUR 30 (A) 02/26/2024 1749   PROTEINUR 1+ (A) 02/26/2024 1451   UROBILINOGEN negative 11/28/2013 1745   NITRITE NEGATIVE 02/26/2024 1749   NITRITE Negative 02/26/2024 1451   LEUKOCYTESUR SMALL (A) 02/26/2024 1749   LEUKOCYTESUR Trace (A) 02/26/2024 1451    Radiological Exams on Admission: US ABDOMEN LIMITED RUQ (LIVER/GB) Result Date: 02/26/2024 CLINICAL DATA:  Jaundice. EXAM: ULTRASOUND ABDOMEN LIMITED RIGHT UPPER QUADRANT COMPARISON:  None Available. FINDINGS: Gallbladder: A 4.7 mm nonshadowing echogenic gallbladder polyp is seen along the nondependent wall of the gallbladder lumen. Shadowing echogenic gallstones are also seen (the largest measures 5.6 mm). The gallbladder wall measures 3.3 mm in thickness. No sonographic Murphy sign noted by sonographer. Common bile duct: Diameter: 7.6  mm Liver: No focal lesion identified. Diffusely increased echogenicity of the liver parenchyma is noted. Portal vein is patent on color Doppler imaging with normal direction of blood flow towards the liver. Other: None. IMPRESSION: 1. Cholelithiasis. 2. 4.7 mm gallbladder polyp, likely benign. No additional follow-up is recommended. This recommendation follows ACR consensus guidelines: White Paper of the ACR Incidental Findings Committee II on Gallbladder and Biliary Findings. J Am Coll Radiol 2013:;10:953-956. 3. Hepatic steatosis. Electronically Signed   By: Aram Candela M.D.   On: 02/26/2024 21:59    Assessment and Plan  Transaminitis Hyperbilirubinemia Epigastric abdominal pain, nausea, vomiting Patient is no longer having abdominal pain, nausea, or vomiting.  Lipase normal.  Ultrasound does show cholelithiasis.  ?Choledocholithiasis given hyperbilirubinemia but alkaline phosphatase is normal.  Acute cholangitis less likely given no fever or leukocytosis.  Acute hepatitis panel negative, mononucleosis screen negative.  GI consulted and recommended IV fluid hydration overnight and repeating labs in the morning.  GI team will decide whether MRCP needs to be ordered to rule out choledocholithiasis. -Keep n.p.o. -Continue IV fluid hydration -Repeat LFTs in the morning -Check acetaminophen level  Hypokalemia Magnesium is within normal range.  Continue to replace potassium and monitor labs.  EKG ordered.  Asymptomatic bacteriuria UA with negative nitrite, small amount of leukocytes, and microscopy showing 6-10 RBCs, >50 WBCs, and rare bacteria.  Patient is not endorsing dysuria or urinary frequency/urgency.  Urine culture ordered.  Hypertension: Currently normotensive. Hyperlipidemia: Hold statin at this time.  DVT prophylaxis: SCDs Code Status: Full Code (discussed with the patient) Family Communication: Husband at bedside. Consults called: GI Level of care: Telemetry bed Admission  status: It is my clinical opinion that referral for OBSERVATION is reasonable and necessary in this patient based on the above information provided. The aforementioned taken together are felt to place the patient at high risk for further clinical deterioration. However, it is anticipated that the patient may be medically stable for discharge from the hospital within 24 to 48 hours.  John Giovanni MD [facility]  If 7PM-7AM, please contact night-coverage [email or web address]  02/27/2024, 3:19 AM

## 2024-02-27 NOTE — Plan of Care (Signed)

## 2024-02-27 NOTE — Assessment & Plan Note (Signed)
-  Holding home pravastatin

## 2024-02-27 NOTE — Assessment & Plan Note (Signed)
 Blood pressure now mildly elevated. Home HCTZ was held and patient was getting IV fluid. -As needed hydralazine

## 2024-02-27 NOTE — Progress Notes (Signed)
 Progress Note   Patient: Kylie Cook WGN:[id number] DOB: 10/24/1981 DOA: 02/26/2024     0 DOS: the patient was seen and examined on 02/27/2024   Brief hospital course: Taken from H&P.  Kylie Cook is a 43 y.o. female with medical history significant of hypertension, hyperlipidemia, obesity (BMI 33.79) sent to the ED by PCP for evaluation of epigastric abdominal pain, nausea, vomiting, jaundice, and bilirubin in her urine.    On presentation hemodynamically stable, Labs showing no leukocytosis, potassium 2.8, magnesium 2.4, AST 159, ALT 243, alk phos 122, T. bili 8.5, lipase normal, serum hCG negative, acute hepatitis panel negative, mononucleosis screen negative.  UA with negative nitrite, small amount of leukocytes, and microscopy showing 6-10 RBCs, >50 WBCs, and rare bacteria.  Right upper quadrant abdominal ultrasound showing cholelithiasis, hepatic steatosis, and a 4.7 mm gallbladder polyp which radiologist thinks is likely benign. ED physician consulted GI (Dr. Elnoria Howard) who recommended IV fluid hydration overnight and repeating labs in the morning.   3/29: Vital stable, improving liver function with significant improvement in T. bili to 3.6.  GI ordered MRCP and if that cannot be done due to heart failure and left ankle then they will do ERCP tomorrow. Likely obstructive jaundice and stone might have passed.  Assessment and Plan: * Elevated liver enzymes Likely obstructive jaundice secondary to choledocholithiasis. Hepatitis panel negative.  Improving liver enzymes with significant improvement of T. bili to 3.6 today.  Nausea, vomiting and abdominal pain resolved.  Likely secondary to choledocholithiasis and seems like stone might have passed. -MRCP ordered by GI -If unable to do MRCP due to hardware in left ankle then they will proceed with ERCP tomorrow -Monitor liver enzymes -Supportive care  Hypokalemia Persistent hypokalemia with potassium at 3 this morning.  Magnesium was  normal -Replete potassium and monitor  Asymptomatic bacteriuria UA with negative nitrite, small amount of leukocytes, and microscopy showing 6-10 RBCs, >50 WBCs, and rare bacteria.  Patient is not endorsing dysuria or urinary frequency/urgency.  Urine cultures ordered Holding antibiotics  HTN (hypertension) Blood pressure now mildly elevated. Home HCTZ was held and patient was getting IV fluid. -As needed hydralazine  HLD (hyperlipidemia) -Holding home pravastatin   Subjective: Patient was seen and examined today.  Denies any more abdominal pain, no nausea or vomiting.  She was feeling much improved.  Physical Exam: Vitals:   02/27/24 0000 02/27/24 0100 02/27/24 0547 02/27/24 0746  BP: 130/74 128/79 129/71 (!) 144/82  Pulse: 66 82 70 69  Resp:  18 15 18   Temp:  97.7 F (36.5 C) 98.9 F (37.2 C) 98.5 F (36.9 C)  TempSrc:  Oral Oral Oral  SpO2: 100% 100% 100% 100%  Weight:      Height:       General.  Obese lady, in no acute distress. Pulmonary.  Lungs clear bilaterally, normal respiratory effort. CV.  Regular rate and rhythm, no JVD, rub or murmur. Abdomen.  Soft, nontender, nondistended, BS positive. CNS.  Alert and oriented .  No focal neurologic deficit. Extremities.  No edema, no cyanosis, pulses intact and symmetrical. Psychiatry.  Judgment and insight appears normal.   Data Reviewed: Prior data reviewed  Family Communication: Discussed with husband at bedside  Disposition: Status is: Observation The patient remains OBS appropriate and will d/c before 2 midnights.  Planned Discharge Destination: Home  Time spent:  minutes  This record has been created using Conservation officer, historic buildings. Errors have been sought and corrected,but may not always be located. Such creation errors  do not reflect on the standard of care.   Author: Arnetha Courser, MD 02/27/2024 12:23 PM  For on call review [email or web address].

## 2024-02-27 NOTE — Assessment & Plan Note (Signed)
 Persistent hypokalemia with potassium at 3 this morning.  Magnesium was normal -Replete potassium and monitor

## 2024-02-27 NOTE — Assessment & Plan Note (Signed)
 Likely obstructive jaundice secondary to choledocholithiasis. Hepatitis panel negative.  Improving liver enzymes with significant improvement of T. bili to 3.6 today.  Nausea, vomiting and abdominal pain resolved.  Likely secondary to choledocholithiasis and seems like stone might have passed. -MRCP ordered by GI -If unable to do MRCP due to hardware in left ankle then they will proceed with ERCP tomorrow -Monitor liver enzymes -Supportive care

## 2024-02-27 NOTE — Consult Note (Signed)
 Reason for Consult:Cholelithiasis and ? choledocholithiasis Referring Physician: [facility]  [name] HPI: This is a 43 year old female with a PMH of a left ankle fracture s/p ORIF (03/2019) and G3P2 admitted for complaints of jaundice and RUQ pain.  Her symptoms were intermittent over the years.  She recalls having abdominal pain/GERD symptoms since the birth of her son 10 years ago.  Intermittently she had pain that would be spaced out by months, but there were times she had the pain that lasted for a few days in a row.  Two days prior to admission she noticed that her urine was darker in color and she thought that this was from dehydration.  There was the onset of RUQ pain with nausea and vomiting as well as scleral icterus.  Evaluation by her PCP showed that she had hyperbilirubinemia and she was instructed to present to the ER.  Her liver panel in the ER was as follows:  AST 159, ALT 243, TB 8.5.  Her pain did gradually resolve and she reports being pain free at this time.  Repeat blood work showed that her liver enzymes and TB markedly declined:  AST 122, ALT 204, and TB 3.6.  The patient denied any fevers in the past with this pain.  Her admission WBC was not elevated.  The RUQ U/S showed cholelithiasis without cholecystitis and her CBD was measured to be 7 mm.  Past Medical History:  Diagnosis Date   Closed left ankle fracture    Complication of anesthesia    Dermatographia    PONV (postoperative nausea and vomiting)     Past Surgical History:  Procedure Laterality Date   CERVICAL CONE BIOPSY     x2   ORIF ANKLE FRACTURE Left 03/22/2019   Procedure: Open treatment of left ankle trimalleolar fracture and syndesmosis injury with internal fixation;  Surgeon: Toni Arthurs, MD;  Location: [facility];  Service: Orthopedics;  Laterality: Left;    WISDOM TOOTH EXTRACTION      Family History  Problem Relation Age of Onset   Hypertension Mother    Diabetes  Mother    Hypertension Father    Hypertension Sister    Bipolar disorder Sister    Cancer Maternal Aunt    Diabetes Maternal Aunt    Diabetes Maternal Uncle    Hypertension Maternal Uncle    Parkinson's disease Maternal Uncle    Lymphoma Maternal Uncle    Thyroid disease Paternal Aunt    Osteoporosis Maternal Grandfather    Cancer Paternal Grandmother    Dementia Paternal Grandmother    Hypertension Paternal Grandfather     Social History:  reports that she quit smoking about 21 years ago. Her smoking use included cigarettes. She has never used smokeless tobacco. She reports current alcohol use. She reports that she does not use drugs.  Allergies:  Allergies  Allergen Reactions   Sulfa Antibiotics Hives and Other (See Comments)    Medications: Scheduled: Continuous:  sodium chloride 125 mL/hr at 02/27/24 0520    Results for orders placed or performed during the hospital encounter of 02/26/24 (from the past 24 hours)  Lipase, blood     Status: None   Collection Time: 02/26/24  5:49 PM  Result Value Ref Range   Lipase 27 11 - 51 U/L  Comprehensive metabolic panel     Status: Abnormal   Collection Time: 02/26/24  5:49 PM  Result Value Ref Range   Sodium 137 135 - 145 mmol/L  Potassium 2.8 (L) 3.5 - 5.1 mmol/L   Chloride 99 98 - 111 mmol/L   CO2 22 22 - 32 mmol/L   Glucose, Bld 125 (H) 70 - 99 mg/dL   BUN 7 6 - 20 mg/dL   Creatinine, Ser 1.61 0.44 - 1.00 mg/dL   Calcium 9.5 8.9 - 09.6 mg/dL   Total Protein 8.2 (H) 6.5 - 8.1 g/dL   Albumin 4.1 3.5 - 5.0 g/dL   AST 045 (H) 15 - 41 U/L   ALT 243 (H) 0 - 44 U/L   Alkaline Phosphatase 122 38 - 126 U/L   Total Bilirubin 8.5 (H) 0.0 - 1.2 mg/dL   GFR, Estimated >40 >98 mL/min   Anion gap 16 (H) 5 - 15  CBC     Status: Abnormal   Collection Time: 02/26/24  5:49 PM  Result Value Ref Range   WBC 10.2 4.0 - 10.5 K/uL   RBC 5.13 (H) 3.87 - 5.11 MIL/uL   Hemoglobin 15.4 (H) 12.0 - 15.0 g/dL   HCT 11.9 14.7 - 82.9 %   MCV  88.3 80.0 - 100.0 fL   MCH 30.0 26.0 - 34.0 pg   MCHC 34.0 30.0 - 36.0 g/dL   RDW 56.2 13.0 - 86.5 %   Platelets 410 (H) 150 - 400 K/uL   nRBC 0.0 0.0 - 0.2 %  Urinalysis, Routine w reflex microscopic -Urine, Clean Catch     Status: Abnormal   Collection Time: 02/26/24  5:49 PM  Result Value Ref Range   Color, Urine AMBER (A) YELLOW   APPearance CLOUDY (A) CLEAR   Specific Gravity, Urine 1.021 1.005 - 1.030   pH 5.0 5.0 - 8.0   Glucose, UA NEGATIVE NEGATIVE mg/dL   Hgb urine dipstick NEGATIVE NEGATIVE   Bilirubin Urine MODERATE (A) NEGATIVE   Ketones, ur 20 (A) NEGATIVE mg/dL   Protein, ur 30 (A) NEGATIVE mg/dL   Nitrite NEGATIVE NEGATIVE   Leukocytes,Ua SMALL (A) NEGATIVE   RBC / HPF 6-10 0 - 5 RBC/hpf   WBC, UA >50 0 - 5 WBC/hpf   Bacteria, UA RARE (A) NONE SEEN   Squamous Epithelial / HPF >50 0 - 5 /HPF   Mucus PRESENT    Non Squamous Epithelial 0-5 (A) NONE SEEN  hCG, serum, qualitative     Status: None   Collection Time: 02/26/24  5:49 PM  Result Value Ref Range   Preg, Serum NEGATIVE NEGATIVE  Bilirubin, fractionated(tot/dir/indir)     Status: Abnormal   Collection Time: 02/26/24  5:49 PM  Result Value Ref Range   Total Bilirubin 8.4 (H) 0.0 - 1.2 mg/dL   Bilirubin, Direct 5.2 (H) 0.0 - 0.2 mg/dL   Indirect Bilirubin 3.2 (H) 0.3 - 0.9 mg/dL  Hepatitis panel, acute     Status: None   Collection Time: 02/26/24 10:17 PM  Result Value Ref Range   Hepatitis B Surface Ag NON REACTIVE NON REACTIVE   HCV Ab NON REACTIVE NON REACTIVE   Hep A IgM NON REACTIVE NON REACTIVE   Hep B C IgM NON REACTIVE NON REACTIVE  Mononucleosis screen     Status: None   Collection Time: 02/26/24 10:17 PM  Result Value Ref Range   Mono Screen NEGATIVE NEGATIVE  Magnesium     Status: None   Collection Time: 02/26/24 10:17 PM  Result Value Ref Range   Magnesium 2.4 1.7 - 2.4 mg/dL  Comprehensive metabolic panel     Status: Abnormal   Collection Time:  02/27/24  9:02 AM  Result Value Ref  Range   Sodium 138 135 - 145 mmol/L   Potassium 3.0 (L) 3.5 - 5.1 mmol/L   Chloride 102 98 - 111 mmol/L   CO2 27 22 - 32 mmol/L   Glucose, Bld 88 70 - 99 mg/dL   BUN <5 (L) 6 - 20 mg/dL   Creatinine, Ser 3.66 0.44 - 1.00 mg/dL   Calcium 8.9 8.9 - 44.0 mg/dL   Total Protein 7.1 6.5 - 8.1 g/dL   Albumin 3.4 (L) 3.5 - 5.0 g/dL   AST 347 (H) 15 - 41 U/L   ALT 204 (H) 0 - 44 U/L   Alkaline Phosphatase 102 38 - 126 U/L   Total Bilirubin 3.6 (H) 0.0 - 1.2 mg/dL   GFR, Estimated >42 >59 mL/min   Anion gap 9 5 - 15  Acetaminophen level     Status: Abnormal   Collection Time: 02/27/24  9:02 AM  Result Value Ref Range   Acetaminophen (Tylenol), Serum <10 (L) 10 - 30 ug/mL     US ABDOMEN LIMITED RUQ (LIVER/GB) Result Date: 02/26/2024 CLINICAL DATA:  Jaundice. EXAM: ULTRASOUND ABDOMEN LIMITED RIGHT UPPER QUADRANT COMPARISON:  None Available. FINDINGS: Gallbladder: A 4.7 mm nonshadowing echogenic gallbladder polyp is seen along the nondependent wall of the gallbladder lumen. Shadowing echogenic gallstones are also seen (the largest measures 5.6 mm). The gallbladder wall measures 3.3 mm in thickness. No sonographic Murphy sign noted by sonographer. Common bile duct: Diameter: 7.6 mm Liver: No focal lesion identified. Diffusely increased echogenicity of the liver parenchyma is noted. Portal vein is patent on color Doppler imaging with normal direction of blood flow towards the liver. Other: None. IMPRESSION: 1. Cholelithiasis. 2. 4.7 mm gallbladder polyp, likely benign. No additional follow-up is recommended. This recommendation follows ACR consensus guidelines: White Paper of the ACR Incidental Findings Committee II on Gallbladder and Biliary Findings. J Am Coll Radiol 2013:;10:953-956. 3. Hepatic steatosis. Electronically Signed   By: Aram Candela M.D.   On: 02/26/2024 21:59    ROS:  As stated above in the HPI otherwise negative.  Blood pressure (!) 144/82, pulse 69, temperature 98.5 F (36.9  C), temperature source Oral, resp. rate 18, height 5' 7.5" (1.715 m), weight 99.3 kg, last menstrual period 02/08/2024, SpO2 100%.    PE: Gen: NAD, Alert and Oriented HEENT:  [place]/AT, EOMI Neck: Supple, no LAD Lungs: CTA Bilaterally CV: RRR without M/G/R ABD: Soft, NTND, +BS Ext: No C/C/E  Assessment/Plan: 1) ? Choledocholithiasis. 2) Cholelithiasis. 3) RUQ abdominal pain - resolved.   Her past history is consistent with a biliary colic.  There was no family history of gallstones, but she had pregnancies and she is obese.  Her liver enzymes have declined and she is well symptomatically.  It is not known if she passed a stone or if this is a "ball-valve" effect.    Plan: 1) MRCP. 2) If the MRCP is not possible with her left ankle plates an EUS +/- ERCP will be performed. 3) NPO after midnight. 4) Follow liver panel.  Jahnae Mcadoo D 02/27/2024, 11:35 AM

## 2024-02-27 NOTE — Assessment & Plan Note (Signed)
 UA with negative nitrite, small amount of leukocytes, and microscopy showing 6-10 RBCs, >50 WBCs, and rare bacteria.  Patient is not endorsing dysuria or urinary frequency/urgency.  Urine cultures ordered Holding antibiotics

## 2024-02-27 NOTE — Progress Notes (Signed)
  Patient is asking to resume to all home medications include for home blood pressure medication and birth control medication. Normal renal function evidence of AKI.  Blood pressure is elevated 150/77. - Resumed hydrocloride thiazide. - Resumed Alesse. -Adding Lipitor given patient has significant transaminitis.   Tereasa Coop, MD [facility] 02/27/2024, 9:36 PM

## 2024-02-28 ENCOUNTER — Encounter (HOSPITAL_COMMUNITY): Payer: Self-pay | Admitting: Internal Medicine

## 2024-02-28 DIAGNOSIS — R748 Abnormal levels of other serum enzymes: Secondary | ICD-10-CM | POA: Diagnosis not present

## 2024-02-28 LAB — COMPREHENSIVE METABOLIC PANEL WITH GFR
ALT: 180 U/L — ABNORMAL HIGH (ref 0–44)
AST: 95 U/L — ABNORMAL HIGH (ref 15–41)
Albumin: 3.2 g/dL — ABNORMAL LOW (ref 3.5–5.0)
Alkaline Phosphatase: 95 U/L (ref 38–126)
Anion gap: 11 (ref 5–15)
BUN: 6 mg/dL (ref 6–20)
CO2: 27 mmol/L (ref 22–32)
Calcium: 9 mg/dL (ref 8.9–10.3)
Chloride: 102 mmol/L (ref 98–111)
Creatinine, Ser: 0.73 mg/dL (ref 0.44–1.00)
GFR, Estimated: >60 mL/min (ref >60)
Glucose, Bld: 87 mg/dL (ref 70–99)
Potassium: 3.3 mmol/L — ABNORMAL LOW (ref 3.5–5.1)
Sodium: 140 mmol/L (ref 135–145)
Total Bilirubin: 1.8 mg/dL — ABNORMAL HIGH (ref 0.0–1.2)
Total Protein: 7 g/dL (ref 6.5–8.1)

## 2024-02-28 LAB — CBC
HCT: 38.1 % (ref 36.0–46.0)
Hemoglobin: 13.2 g/dL (ref 12.0–15.0)
MCH: 30.1 pg (ref 26.0–34.0)
MCHC: 34.6 g/dL (ref 30.0–36.0)
MCV: 86.8 fL (ref 80.0–100.0)
Platelets: 321 10*3/uL (ref 150–400)
RBC: 4.39 MIL/uL (ref 3.87–5.11)
RDW: 13.1 % (ref 11.5–15.5)
WBC: 5.1 10*3/uL (ref 4.0–10.5)
nRBC: 0 % (ref 0.0–0.2)

## 2024-02-28 LAB — URINE CULTURE

## 2024-02-28 LAB — SURGICAL PCR SCREEN
MRSA, PCR: NEGATIVE
Staphylococcus aureus: NEGATIVE

## 2024-02-28 MED ORDER — SODIUM CHLORIDE 0.9 % IV SOLN
2.0000 g | INTRAVENOUS | Status: AC
  Administered 2024-02-29: 2 g via INTRAVENOUS
  Filled 2024-02-28: qty 20

## 2024-02-28 MED ORDER — POTASSIUM CHLORIDE CRYS ER 20 MEQ PO TBCR
40.0000 meq | EXTENDED_RELEASE_TABLET | Freq: Two times a day (BID) | ORAL | Status: DC
  Administered 2024-02-28 – 2024-02-29 (×3): 40 meq via ORAL
  Filled 2024-02-28 (×3): qty 2

## 2024-02-28 NOTE — H&P (View-Only) (Signed)
 Kylie Cook March 19, 1981  [id number].    Requesting MD: Dr. Dorcas Carrow Chief Complaint/Reason for Consult: cholelithiasis  HPI:  This is a pleasant 43 yo female with a history of HTN and HLD who noticed some jaundice on Wednesday and had been having some epigastric discomfort after eating.  She had some N/V on Thursday.  She presented to the ED on Friday due to her concern for her jaundice. She was noted to have a TB >8.  She underwent an MRCP which revealed no choledocholithiasis and her TB came down to 1.8.  she states she has been having post-prandial epigastric abdominal pain for the last several years.  We have been asked to see her for lap chole.  ROS: ROS: see HPI  Family History  Problem Relation Age of Onset   Hypertension Mother    Diabetes Mother    Hypertension Father    Hypertension Sister    Bipolar disorder Sister    Cancer Maternal Aunt    Diabetes Maternal Aunt    Diabetes Maternal Uncle    Hypertension Maternal Uncle    Parkinson's disease Maternal Uncle    Lymphoma Maternal Uncle    Thyroid disease Paternal Aunt    Osteoporosis Maternal Grandfather    Cancer Paternal Grandmother    Dementia Paternal Grandmother    Hypertension Paternal Grandfather     Past Medical History:  Diagnosis Date   Closed left ankle fracture    Complication of anesthesia    Dermatographia    HLD (hyperlipidemia)    Hypertension    PONV (postoperative nausea and vomiting)     Past Surgical History:  Procedure Laterality Date   CERVICAL CONE BIOPSY     x2   ORIF ANKLE FRACTURE Left 03/22/2019   Procedure: Open treatment of left ankle trimalleolar fracture and syndesmosis injury with internal fixation;  Surgeon: Toni Arthurs, MD;  Location: [facility];  Service: Orthopedics;  Laterality: Left;    WISDOM TOOTH EXTRACTION      Social History:  reports that she quit smoking about 21 years ago. Her smoking use included cigarettes. She has never  used smokeless tobacco. She reports current alcohol use. She reports that she does not use drugs.  Allergies:  Allergies  Allergen Reactions   Sulfa Antibiotics Hives and Other (See Comments)    Medications Prior to Admission  Medication Sig Dispense Refill   hydrochlorothiazide (HYDRODIURIL) 25 MG tablet Take 1 tablet (25 mg total) by mouth daily. 90 tablet 1   levonorgestrel-ethinyl estradiol (ALESSE) 0.1-20 MG-MCG tablet Take 1 tablet by mouth daily.     Multiple Vitamins-Minerals (MULTI-VITAMIN GUMMIES PO) Take 2 tablets by mouth daily.     pravastatin (PRAVACHOL) 10 MG tablet TAKE 1 TABLET BY MOUTH EVERY DAY 90 tablet 0     Physical Exam: Blood pressure (!) 146/92, pulse 73, temperature 98.4 F (36.9 C), temperature source Oral, resp. rate 18, height 5' 7.5" (1.715 m), weight 99.3 kg, last menstrual period 02/08/2024, SpO2 100%. General: pleasant, WD, WN white female who is laying in bed in NAD HEENT: head is normocephalic, atraumatic.  Sclera are noninjected.  PERRL.  Ears and nose without any masses or lesions.  Mouth is pink and moist Heart: regular, rate, and rhythm.  Normal s1,s2. No obvious murmurs, gallops, or rubs noted.  Lungs: CTAB, no wheezes, rhonchi, or rales noted.  Respiratory effort nonlabored Abd: soft, NT, ND, +BS, no masses, hernias, or organomegaly Psych: A&Ox3 with an appropriate  affect.   Results for orders placed or performed during the hospital encounter of 02/26/24 (from the past 48 hours)  Lipase, blood     Status: None   Collection Time: 02/26/24  5:49 PM  Result Value Ref Range   Lipase 27 11 - 51 U/L    Comment: Performed at [facility], [address], [place], [place] [address]  Comprehensive metabolic panel     Status: Abnormal   Collection Time: 02/26/24  5:49 PM  Result Value Ref Range   Sodium 137 135 - 145 mmol/L   Potassium 2.8 (L) 3.5 - 5.1 mmol/L   Chloride 99 98 - 111 mmol/L   CO2 22 22 - 32 mmol/L   Glucose, Bld 125 (H) 70  - 99 mg/dL    Comment: Glucose reference range applies only to samples taken after fasting for at least 8 hours.   BUN 7 6 - 20 mg/dL   Creatinine, Ser 6.04 0.44 - 1.00 mg/dL   Calcium 9.5 8.9 - 54.0 mg/dL   Total Protein 8.2 (H) 6.5 - 8.1 g/dL   Albumin 4.1 3.5 - 5.0 g/dL   AST 981 (H) 15 - 41 U/L   ALT 243 (H) 0 - 44 U/L   Alkaline Phosphatase 122 38 - 126 U/L   Total Bilirubin 8.5 (H) 0.0 - 1.2 mg/dL   GFR, Estimated >19 >14 mL/min    Comment: (NOTE) Calculated using the CKD-EPI Creatinine Equation (2021)    Anion gap 16 (H) 5 - 15    Comment: Performed at [facility], [address], [place], [place] [address]  CBC     Status: Abnormal   Collection Time: 02/26/24  5:49 PM  Result Value Ref Range   WBC 10.2 4.0 - 10.5 K/uL   RBC 5.13 (H) 3.87 - 5.11 MIL/uL   Hemoglobin 15.4 (H) 12.0 - 15.0 g/dL   HCT 62.1 30.8 - 65.7 %   MCV 88.3 80.0 - 100.0 fL   MCH 30.0 26.0 - 34.0 pg   MCHC 34.0 30.0 - 36.0 g/dL   RDW 84.6 96.2 - 95.2 %   Platelets 410 (H) 150 - 400 K/uL   nRBC 0.0 0.0 - 0.2 %    Comment: Performed at [facility], [address], [place], [place] [address]  Urinalysis, Routine w reflex microscopic -Urine, Clean Catch     Status: Abnormal   Collection Time: 02/26/24  5:49 PM  Result Value Ref Range   Color, Urine AMBER (A) YELLOW    Comment: BIOCHEMICALS MAY BE AFFECTED BY COLOR   APPearance CLOUDY (A) CLEAR   Specific Gravity, Urine 1.021 1.005 - 1.030   pH 5.0 5.0 - 8.0   Glucose, UA NEGATIVE NEGATIVE mg/dL   Hgb urine dipstick NEGATIVE NEGATIVE   Bilirubin Urine MODERATE (A) NEGATIVE   Ketones, ur 20 (A) NEGATIVE mg/dL   Protein, ur 30 (A) NEGATIVE mg/dL   Nitrite NEGATIVE NEGATIVE   Leukocytes,Ua SMALL (A) NEGATIVE   RBC / HPF 6-10 0 - 5 RBC/hpf   WBC, UA >50 0 - 5 WBC/hpf   Bacteria, UA RARE (A) NONE SEEN   Squamous Epithelial / HPF >50 0 - 5 /HPF   Mucus PRESENT    Non Squamous Epithelial 0-5 (A) NONE SEEN    Comment: Performed at [facility], [address], [place], [place] [address]  hCG, serum, qualitative     Status: None   Collection Time: 02/26/24  5:49 PM  Result Value Ref Range   Preg, Serum NEGATIVE NEGATIVE    Comment:        THE SENSITIVITY OF THIS METHODOLOGY IS >10 mIU/mL. Performed at [facility], [address], [place], [place] [address]   Bilirubin, fractionated(tot/dir/indir)     Status: Abnormal   Collection Time: 02/26/24  5:49 PM  Result Value Ref Range   Total Bilirubin 8.4 (H) 0.0 - 1.2 mg/dL   Bilirubin, Direct 5.2 (H) 0.0 - 0.2 mg/dL   Indirect Bilirubin 3.2 (H) 0.3 - 0.9 mg/dL    Comment: Performed at [facility], [address] [place], [place] [address]  Hepatitis panel, acute     Status: None   Collection Time: 02/26/24 10:17 PM  Result Value Ref Range   Hepatitis B Surface Ag NON REACTIVE NON REACTIVE   HCV Ab NON REACTIVE NON REACTIVE    Comment: (NOTE) Nonreactive HCV antibody screen is consistent with no HCV infections,  unless recent infection is suspected or other evidence exists to indicate HCV infection.     Hep A IgM NON REACTIVE NON REACTIVE   Hep B C IgM NON REACTIVE NON REACTIVE    Comment: Performed at [facility], [address] [place], [place] [address]  Mononucleosis screen     Status: None   Collection Time: 02/26/24 10:17 PM  Result Value Ref Range   Mono Screen NEGATIVE NEGATIVE    Comment: Performed at [facility], [address], [place], [place] [address]  Magnesium     Status: None   Collection Time: 02/26/24 10:17 PM  Result Value Ref Range   Magnesium 2.4 1.7 - 2.4 mg/dL    Comment: Performed at [facility], [address] [place], [place] [address]  Urine Culture (for pregnant, neutropenic or urologic patients or patients with an indwelling urinary catheter)     Status: Abnormal (Preliminary result)   Collection Time: 02/27/24  4:13 AM   Specimen: Urine, Clean Catch  Result Value Ref Range   Specimen  Description URINE, CLEAN CATCH    Special Requests      NONE Performed at [facility], [address] [place], [place] [address]    Culture MULTIPLE SPECIES PRESENT, SUGGEST RECOLLECTION (A)    Report Status PENDING   Comprehensive metabolic panel     Status: Abnormal   Collection Time: 02/27/24  9:02 AM  Result Value Ref Range   Sodium 138 135 - 145 mmol/L   Potassium 3.0 (L) 3.5 - 5.1 mmol/L   Chloride 102 98 - 111 mmol/L   CO2 27 22 - 32 mmol/L   Glucose, Bld 88 70 - 99 mg/dL    Comment: Glucose reference range applies only to samples taken after fasting for at least 8 hours.   BUN <5 (L) 6 - 20 mg/dL   Creatinine, Ser 5.78 0.44 - 1.00 mg/dL   Calcium 8.9 8.9 - 46.9 mg/dL   Total Protein 7.1 6.5 - 8.1 g/dL   Albumin 3.4 (L) 3.5 - 5.0 g/dL   AST 629 (H) 15 - 41 U/L   ALT 204 (H) 0 - 44 U/L   Alkaline Phosphatase 102 38 - 126 U/L   Total Bilirubin 3.6 (H) 0.0 - 1.2 mg/dL   GFR, Estimated >52 >84 mL/min    Comment: (NOTE) Calculated using the CKD-EPI Creatinine Equation (2021)    Anion gap 9 5 - 15    Comment:  Performed at [facility], [address], [place], [place] [address]  Acetaminophen level     Status: Abnormal   Collection Time: 02/27/24  9:02 AM  Result Value Ref Range   Acetaminophen (Tylenol), Serum <10 (L) 10 - 30 ug/mL    Comment: (NOTE) Therapeutic concentrations vary significantly. A range of 10-30 ug/mL  may be an effective concentration for many patients. However, some  are best treated at concentrations outside of this range. Acetaminophen concentrations >150 ug/mL at 4 hours after ingestion  and >50 ug/mL at 12 hours after ingestion are often associated with  toxic reactions.  Performed at [facility], [address] [place], [place] [address]   Comprehensive metabolic panel with GFR     Status: Abnormal   Collection Time: 02/28/24  6:56 AM  Result Value Ref Range   Sodium 140 135 - 145 mmol/L   Potassium 3.3 (L) 3.5 - 5.1  mmol/L   Chloride 102 98 - 111 mmol/L   CO2 27 22 - 32 mmol/L   Glucose, Bld 87 70 - 99 mg/dL    Comment: Glucose reference range applies only to samples taken after fasting for at least 8 hours.   BUN 6 6 - 20 mg/dL   Creatinine, Ser 0.98 0.44 - 1.00 mg/dL   Calcium 9.0 8.9 - 11.9 mg/dL   Total Protein 7.0 6.5 - 8.1 g/dL   Albumin 3.2 (L) 3.5 - 5.0 g/dL   AST 95 (H) 15 - 41 U/L   ALT 180 (H) 0 - 44 U/L   Alkaline Phosphatase 95 38 - 126 U/L   Total Bilirubin 1.8 (H) 0.0 - 1.2 mg/dL   GFR, Estimated >14 >78 mL/min    Comment: (NOTE) Calculated using the CKD-EPI Creatinine Equation (2021)    Anion gap 11 5 - 15    Comment: Performed at [facility], [address], [place], [place] [address]  CBC     Status: None   Collection Time: 02/28/24  6:56 AM  Result Value Ref Range   WBC 5.1 4.0 - 10.5 K/uL   RBC 4.39 3.87 - 5.11 MIL/uL   Hemoglobin 13.2 12.0 - 15.0 g/dL   HCT 13.0 86.5 - 78.4 %   MCV 86.8 80.0 - 100.0 fL   MCH 30.1 26.0 - 34.0 pg   MCHC 34.6 30.0 - 36.0 g/dL   RDW 69.6 29.5 - 28.4 %   Platelets 321 150 - 400 K/uL   nRBC 0.0 0.0 - 0.2 %    Comment: Performed at [facility], [address] [place], [place] [address]   MR ABDOMEN MRCP W WO CONTAST Result Date: 02/27/2024 CLINICAL DATA:  Jaundice. Biliary obstruction suspected. Gallstones on ultrasound. EXAM: MRI ABDOMEN WITHOUT AND WITH CONTRAST (INCLUDING MRCP) TECHNIQUE: Multiplanar multisequence MR imaging of the abdomen was performed both before and after the administration of intravenous contrast. Heavily T2-weighted images of the biliary and pancreatic ducts were obtained, and three-dimensional MRCP images were rendered by post processing. CONTRAST:  10mL GADAVIST GADOBUTROL 1 MMOL/ML IV SOLN COMPARISON:  Right upper quadrant abdominal ultrasound 02/26/2024. No other comparison studies. FINDINGS: Technical note: Despite efforts by the technologist and patient, mild motion artifact is present on today's  exam and could not be eliminated. This reduces exam sensitivity and specificity. Lower chest:  The visualized lower chest appears unremarkable. Hepatobiliary: The liver has a non cirrhotic morphology. There is no significant steatosis, focal abnormality or abnormal enhancement. As seen  on ultrasound, there are small gallstones and possible small polyps. The gallbladder is not significantly distended, but there is mild pericholecystic fluid. Sonographic Eulah Pont sign was reported as absent on earlier ultrasound. No evidence of biliary dilatation or choledocholithiasis. Pancreas: Unremarkable. No pancreatic ductal dilatation or surrounding inflammatory changes. Spleen: Normal in size without focal abnormality. Adrenals/Urinary Tract: Both adrenal glands appear normal. No evidence of renal mass, hydronephrosis or perinephric soft tissue stranding. Stomach/Bowel: The stomach appears unremarkable for its degree of distension. No evidence of bowel wall thickening, distention or surrounding inflammatory change. Vascular/Lymphatic: There are no enlarged abdominal lymph nodes. No significant vascular findings. The portal, superior mesenteric and splenic veins are patent. Other: No ascites. Musculoskeletal: No acute or significant osseous findings. IMPRESSION: 1. Cholelithiasis with mild pericholecystic fluid. Sonographic Eulah Pont sign was reported as absent on recent ultrasound. Findings are equivocal for acute cholecystitis. Consider nuclear medicine HIDA scan for further evaluation. 2. No evidence of biliary dilatation or choledocholithiasis. The liver appears normal. 3. No other significant findings. Electronically Signed   By: Carey Bullocks M.D.   On: 02/27/2024 17:15   MR 3D Recon At Scanner Result Date: 02/27/2024 CLINICAL DATA:  Jaundice. Biliary obstruction suspected. Gallstones on ultrasound. EXAM: MRI ABDOMEN WITHOUT AND WITH CONTRAST (INCLUDING MRCP) TECHNIQUE: Multiplanar multisequence MR imaging of the abdomen  was performed both before and after the administration of intravenous contrast. Heavily T2-weighted images of the biliary and pancreatic ducts were obtained, and three-dimensional MRCP images were rendered by post processing. CONTRAST:  10mL GADAVIST GADOBUTROL 1 MMOL/ML IV SOLN COMPARISON:  Right upper quadrant abdominal ultrasound 02/26/2024. No other comparison studies. FINDINGS: Technical note: Despite efforts by the technologist and patient, mild motion artifact is present on today's exam and could not be eliminated. This reduces exam sensitivity and specificity. Lower chest:  The visualized lower chest appears unremarkable. Hepatobiliary: The liver has a non cirrhotic morphology. There is no significant steatosis, focal abnormality or abnormal enhancement. As seen on ultrasound, there are small gallstones and possible small polyps. The gallbladder is not significantly distended, but there is mild pericholecystic fluid. Sonographic Eulah Pont sign was reported as absent on earlier ultrasound. No evidence of biliary dilatation or choledocholithiasis. Pancreas: Unremarkable. No pancreatic ductal dilatation or surrounding inflammatory changes. Spleen: Normal in size without focal abnormality. Adrenals/Urinary Tract: Both adrenal glands appear normal. No evidence of renal mass, hydronephrosis or perinephric soft tissue stranding. Stomach/Bowel: The stomach appears unremarkable for its degree of distension. No evidence of bowel wall thickening, distention or surrounding inflammatory change. Vascular/Lymphatic: There are no enlarged abdominal lymph nodes. No significant vascular findings. The portal, superior mesenteric and splenic veins are patent. Other: No ascites. Musculoskeletal: No acute or significant osseous findings. IMPRESSION: 1. Cholelithiasis with mild pericholecystic fluid. Sonographic Eulah Pont sign was reported as absent on recent ultrasound. Findings are equivocal for acute cholecystitis. Consider nuclear  medicine HIDA scan for further evaluation. 2. No evidence of biliary dilatation or choledocholithiasis. The liver appears normal. 3. No other significant findings. Electronically Signed   By: Carey Bullocks M.D.   On: 02/27/2024 17:15   US ABDOMEN LIMITED RUQ (LIVER/GB) Result Date: 02/26/2024 CLINICAL DATA:  Jaundice. EXAM: ULTRASOUND ABDOMEN LIMITED RIGHT UPPER QUADRANT COMPARISON:  None Available. FINDINGS: Gallbladder: A 4.7 mm nonshadowing echogenic gallbladder polyp is seen along the nondependent wall of the gallbladder lumen. Shadowing echogenic gallstones are also seen (the largest measures 5.6 mm). The gallbladder wall measures 3.3 mm in thickness. No sonographic Murphy sign noted by sonographer. Common bile duct: Diameter: 7.6  mm Liver: No focal lesion identified. Diffusely increased echogenicity of the liver parenchyma is noted. Portal vein is patent on color Doppler imaging with normal direction of blood flow towards the liver. Other: None. IMPRESSION: 1. Cholelithiasis. 2. 4.7 mm gallbladder polyp, likely benign. No additional follow-up is recommended. This recommendation follows ACR consensus guidelines: White Paper of the ACR Incidental Findings Committee II on Gallbladder and Biliary Findings. J Am Coll Radiol 2013:;10:953-956. 3. Hepatic steatosis. Electronically Signed   By: Aram Candela M.D.   On: 02/26/2024 21:59      Assessment/Plan Biliary colic, choledocholithiasis The patient has been seen, examined, labs, vitals, imaging, and chart reviewed.  The patient likely presented with choledocholithiasis that has passed, and her labs and symptoms are improving.  We will plan to proceed with a lap chole tomorrow.  If this goes well then she can likely go home later tomorrow afternoon.  Briefly discussed this plan with her and her husband at bedside.  They are agreeable.     FEN - regular, NPO p MN VTE - per primary, ok from our standpoint ID - none currently, Rocephin on call to  OR   HTN HLD  I reviewed Consultant GI notes, hospitalist notes, last 24 h vitals and pain scores, last 48 h intake and output, last 24 h labs and trends, and last 24 h imaging results.  Letha Cape, [facility] 02/28/2024, 11:45 AM Please see Amion for pager number during day hours 7:00am-4:30pm or 7:00am -11:30am on weekends

## 2024-02-28 NOTE — Progress Notes (Signed)
 PROGRESS NOTE    Kylie Cook  WUJ:[id number] DOB: 1981/02/24 DOA: 02/26/2024 PCP: Sonny Masters, FNP    Brief Narrative:  43 year old with history of hypertension, hyperlipidemia, obesity with previous history of chronic dyspepsia sent to ER by her PCP with epigastric abdominal pain nausea vomiting and bilirubin in her urine.  In the emergency room potassium 2.8, AST and ALT 159/243 with total bili of 8.5.  Treated with IV fluids.  Admitted to the hospital with GI consultation.   Subjective: Patient seen in the morning rounds.  Husband at the bedside.  Currently denies any complaints.  Eating regular diet.  LFTs improved.  Discussed with her about need for cholecystectomy before discharge and patient is agreeable. Assessment & Plan:   Choledocholithiasis, obstructive jaundice improving.  Likely passed bile duct stone: Treated with IV fluids and pain medications.  Underwent MRCP and did not show any evidence of obstructive bile duct stone.  LFTs normalizing.  Seen by GI.  Recommended cholecystectomy. No clinical evidence of infection. Continue supportive care. Given severity of presentation, will benefit with cholecystectomy sooner than later.  Surgery consulted.  Hypokalemia: Replace.  Essential hypertension: Blood pressure mildly elevated.  On hydrochlorothiazide.  Resume along with potassium.  Hyperlipidemia: On pravastatin.  Hold until LFTs normalize.   Case discussed with GI and surgery.    DVT prophylaxis: SCDs Start: 02/27/24 0410   Code Status: Full code Family Communication: Husband at the bedside Disposition Plan: Status is: Inpatient Remains inpatient appropriate because: Inpatient procedure planned     Consultants:  GI Surgery  Procedures:  None  Antimicrobials:  None     Objective: Vitals:   02/27/24 0746 02/27/24 1654 02/27/24 2126 02/28/24 0734  BP: (!) 144/82 (!) 143/84 (!) 150/77 (!) 146/92  Pulse: 69 70 74 73  Resp: 18 18 18    Temp:  98.5 F (36.9 C)  98.4 F (36.9 C) 98.4 F (36.9 C)  TempSrc: Oral  Oral Oral  SpO2: 100% 100% 100% 100%  Weight:      Height:        Intake/Output Summary (Last 24 hours) at 02/28/2024 1120 Last data filed at 02/28/2024 0900 Gross per 24 hour  Intake 480 ml  Output 0 ml  Net 480 ml   Filed Weights   02/26/24 1745  Weight: 99.3 kg    Examination:  General exam: Appears calm and comfortable  Respiratory system: Clear to auscultation. Respiratory effort normal. Cardiovascular system: S1 & S2 heard, RRR.  Gastrointestinal system: Soft.  Nontender.  Bowel sound present. Central nervous system: Alert and oriented. No focal neurological deficits. Extremities: Symmetric 5 x 5 power. Skin: No rashes, lesions or ulcers Psychiatry: Judgement and insight appear normal. Mood & affect appropriate.     Data Reviewed: I have personally reviewed following labs and imaging studies  CBC: Recent Labs  Lab 02/26/24 1749 02/28/24 0656  WBC 10.2 5.1  HGB 15.4* 13.2  HCT 45.3 38.1  MCV 88.3 86.8  PLT 410* 321   Basic Metabolic Panel: Recent Labs  Lab 02/26/24 1749 02/26/24 2217 02/27/24 0902 02/28/24 0656  NA 137  --  138 140  K 2.8*  --  3.0* 3.3*  CL 99  --  102 102  CO2 22  --  27 27  GLUCOSE 125*  --  88 87  BUN 7  --  <5* 6  CREATININE 0.87  --  0.66 0.73  CALCIUM 9.5  --  8.9 9.0  MG  --  2.4  --   --  GFR: Estimated Creatinine Clearance: 113.1 mL/min (by C-G formula based on SCr of 0.73 mg/dL). Liver Function Tests: Recent Labs  Lab 02/26/24 1749 02/27/24 0902 02/28/24 0656  AST 159* 122* 95*  ALT 243* 204* 180*  ALKPHOS 122 102 95  BILITOT 8.4*  8.5* 3.6* 1.8*  PROT 8.2* 7.1 7.0  ALBUMIN 4.1 3.4* 3.2*   Recent Labs  Lab 02/26/24 1749  LIPASE 27   No results for input(s): "AMMONIA" in the last 168 hours. Coagulation Profile: No results for input(s): "INR", "PROTIME" in the last 168 hours. Cardiac Enzymes: No results for input(s): "CKTOTAL",  "CKMB", "CKMBINDEX", "TROPONINI" in the last 168 hours. BNP (last 3 results) No results for input(s): "PROBNP" in the last 8760 hours. HbA1C: No results for input(s): "HGBA1C" in the last 72 hours. CBG: No results for input(s): "GLUCAP" in the last 168 hours. Lipid Profile: No results for input(s): "CHOL", "HDL", "LDLCALC", "TRIG", "CHOLHDL", "LDLDIRECT" in the last 72 hours. Thyroid Function Tests: No results for input(s): "TSH", "T4TOTAL", "FREET4", "T3FREE", "THYROIDAB" in the last 72 hours. Anemia Panel: No results for input(s): "VITAMINB12", "FOLATE", "FERRITIN", "TIBC", "IRON", "RETICCTPCT" in the last 72 hours. Sepsis Labs: No results for input(s): "PROCALCITON", "LATICACIDVEN" in the last 168 hours.  Recent Results (from the past 240 hours)  Microscopic Examination     Status: Abnormal   Collection Time: 02/26/24  2:51 PM   Urine  Result Value Ref Range Status   WBC, UA 0-5 0 - 5 /hpf Final   RBC, Urine None seen 0 - 2 /hpf Final   Epithelial Cells (non renal) 0-10 0 - 10 /hpf Final   Renal Epithel, UA None seen None seen /hpf Final   Bacteria, UA Few (A) None seen/Few Final   Yeast, UA None seen None seen Final  Urine Culture     Status: None   Collection Time: 02/26/24  3:37 PM   Specimen: Urine   UR  Result Value Ref Range Status   Urine Culture, Routine Final report  Final   Organism ID, Bacteria Comment  Final    Comment: Mixed urogenital flora 10,000-25,000 colony forming units per mL   Urine Culture (for pregnant, neutropenic or urologic patients or patients with an indwelling urinary catheter)     Status: Abnormal (Preliminary result)   Collection Time: 02/27/24  4:13 AM   Specimen: Urine, Clean Catch  Result Value Ref Range Status   Specimen Description URINE, CLEAN CATCH  Final   Special Requests   Final    NONE Performed at [facility], [address], [place], [place] [address]    Culture MULTIPLE SPECIES PRESENT, SUGGEST RECOLLECTION (A)   Final   Report Status PENDING  Incomplete         Radiology Studies: MR ABDOMEN MRCP W WO CONTAST Result Date: 02/27/2024 CLINICAL DATA:  Jaundice. Biliary obstruction suspected. Gallstones on ultrasound. EXAM: MRI ABDOMEN WITHOUT AND WITH CONTRAST (INCLUDING MRCP) TECHNIQUE: Multiplanar multisequence MR imaging of the abdomen was performed both before and after the administration of intravenous contrast. Heavily T2-weighted images of the biliary and pancreatic ducts were obtained, and three-dimensional MRCP images were rendered by post processing. CONTRAST:  10mL GADAVIST GADOBUTROL 1 MMOL/ML IV SOLN COMPARISON:  Right upper quadrant abdominal ultrasound 02/26/2024. No other comparison studies. FINDINGS: Technical note: Despite efforts by the technologist and patient, mild motion artifact is present on today's exam and could not be eliminated. This reduces exam sensitivity and specificity. Lower chest:  The visualized lower chest  appears unremarkable. Hepatobiliary: The liver has a non cirrhotic morphology. There is no significant steatosis, focal abnormality or abnormal enhancement. As seen on ultrasound, there are small gallstones and possible small polyps. The gallbladder is not significantly distended, but there is mild pericholecystic fluid. Sonographic Eulah Pont sign was reported as absent on earlier ultrasound. No evidence of biliary dilatation or choledocholithiasis. Pancreas: Unremarkable. No pancreatic ductal dilatation or surrounding inflammatory changes. Spleen: Normal in size without focal abnormality. Adrenals/Urinary Tract: Both adrenal glands appear normal. No evidence of renal mass, hydronephrosis or perinephric soft tissue stranding. Stomach/Bowel: The stomach appears unremarkable for its degree of distension. No evidence of bowel wall thickening, distention or surrounding inflammatory change. Vascular/Lymphatic: There are no enlarged abdominal lymph nodes. No significant vascular findings.  The portal, superior mesenteric and splenic veins are patent. Other: No ascites. Musculoskeletal: No acute or significant osseous findings. IMPRESSION: 1. Cholelithiasis with mild pericholecystic fluid. Sonographic Eulah Pont sign was reported as absent on recent ultrasound. Findings are equivocal for acute cholecystitis. Consider nuclear medicine HIDA scan for further evaluation. 2. No evidence of biliary dilatation or choledocholithiasis. The liver appears normal. 3. No other significant findings. Electronically Signed   By: Carey Bullocks M.D.   On: 02/27/2024 17:15   MR 3D Recon At Scanner Result Date: 02/27/2024 CLINICAL DATA:  Jaundice. Biliary obstruction suspected. Gallstones on ultrasound. EXAM: MRI ABDOMEN WITHOUT AND WITH CONTRAST (INCLUDING MRCP) TECHNIQUE: Multiplanar multisequence MR imaging of the abdomen was performed both before and after the administration of intravenous contrast. Heavily T2-weighted images of the biliary and pancreatic ducts were obtained, and three-dimensional MRCP images were rendered by post processing. CONTRAST:  10mL GADAVIST GADOBUTROL 1 MMOL/ML IV SOLN COMPARISON:  Right upper quadrant abdominal ultrasound 02/26/2024. No other comparison studies. FINDINGS: Technical note: Despite efforts by the technologist and patient, mild motion artifact is present on today's exam and could not be eliminated. This reduces exam sensitivity and specificity. Lower chest:  The visualized lower chest appears unremarkable. Hepatobiliary: The liver has a non cirrhotic morphology. There is no significant steatosis, focal abnormality or abnormal enhancement. As seen on ultrasound, there are small gallstones and possible small polyps. The gallbladder is not significantly distended, but there is mild pericholecystic fluid. Sonographic Eulah Pont sign was reported as absent on earlier ultrasound. No evidence of biliary dilatation or choledocholithiasis. Pancreas: Unremarkable. No pancreatic ductal  dilatation or surrounding inflammatory changes. Spleen: Normal in size without focal abnormality. Adrenals/Urinary Tract: Both adrenal glands appear normal. No evidence of renal mass, hydronephrosis or perinephric soft tissue stranding. Stomach/Bowel: The stomach appears unremarkable for its degree of distension. No evidence of bowel wall thickening, distention or surrounding inflammatory change. Vascular/Lymphatic: There are no enlarged abdominal lymph nodes. No significant vascular findings. The portal, superior mesenteric and splenic veins are patent. Other: No ascites. Musculoskeletal: No acute or significant osseous findings. IMPRESSION: 1. Cholelithiasis with mild pericholecystic fluid. Sonographic Eulah Pont sign was reported as absent on recent ultrasound. Findings are equivocal for acute cholecystitis. Consider nuclear medicine HIDA scan for further evaluation. 2. No evidence of biliary dilatation or choledocholithiasis. The liver appears normal. 3. No other significant findings. Electronically Signed   By: Carey Bullocks M.D.   On: 02/27/2024 17:15   US ABDOMEN LIMITED RUQ (LIVER/GB) Result Date: 02/26/2024 CLINICAL DATA:  Jaundice. EXAM: ULTRASOUND ABDOMEN LIMITED RIGHT UPPER QUADRANT COMPARISON:  None Available. FINDINGS: Gallbladder: A 4.7 mm nonshadowing echogenic gallbladder polyp is seen along the nondependent wall of the gallbladder lumen. Shadowing echogenic gallstones are also seen (the largest measures  5.6 mm). The gallbladder wall measures 3.3 mm in thickness. No sonographic Murphy sign noted by sonographer. Common bile duct: Diameter: 7.6 mm Liver: No focal lesion identified. Diffusely increased echogenicity of the liver parenchyma is noted. Portal vein is patent on color Doppler imaging with normal direction of blood flow towards the liver. Other: None. IMPRESSION: 1. Cholelithiasis. 2. 4.7 mm gallbladder polyp, likely benign. No additional follow-up is recommended. This recommendation follows  ACR consensus guidelines: White Paper of the ACR Incidental Findings Committee II on Gallbladder and Biliary Findings. J Am Coll Radiol 2013:;10:953-956. 3. Hepatic steatosis. Electronically Signed   By: Aram Candela M.D.   On: 02/26/2024 21:59        Scheduled Meds:  hydrochlorothiazide  25 mg Oral Daily   levonorgestrel-ethinyl estradiol  1 tablet Oral Daily   potassium chloride  40 mEq Oral BID   Continuous Infusions:   LOS: 1 day    Time spent: 35 minutes    Dorcas Carrow, MD [facility]

## 2024-02-28 NOTE — Plan of Care (Signed)
   Problem: Health Behavior/Discharge Planning: Goal: Ability to manage health-related needs will improve Outcome: Completed/Met

## 2024-02-28 NOTE — Plan of Care (Signed)

## 2024-02-28 NOTE — Consult Note (Signed)
 Kylie Cook March 19, 1981  [id number].    Requesting MD: Dr. Dorcas Carrow Chief Complaint/Reason for Consult: cholelithiasis  HPI:  This is a pleasant 43 yo female with a history of HTN and HLD who noticed some jaundice on Wednesday and had been having some epigastric discomfort after eating.  She had some N/V on Thursday.  She presented to the ED on Friday due to her concern for her jaundice. She was noted to have a TB >8.  She underwent an MRCP which revealed no choledocholithiasis and her TB came down to 1.8.  she states she has been having post-prandial epigastric abdominal pain for the last several years.  We have been asked to see her for lap chole.  ROS: ROS: see HPI  Family History  Problem Relation Age of Onset   Hypertension Mother    Diabetes Mother    Hypertension Father    Hypertension Sister    Bipolar disorder Sister    Cancer Maternal Aunt    Diabetes Maternal Aunt    Diabetes Maternal Uncle    Hypertension Maternal Uncle    Parkinson's disease Maternal Uncle    Lymphoma Maternal Uncle    Thyroid disease Paternal Aunt    Osteoporosis Maternal Grandfather    Cancer Paternal Grandmother    Dementia Paternal Grandmother    Hypertension Paternal Grandfather     Past Medical History:  Diagnosis Date   Closed left ankle fracture    Complication of anesthesia    Dermatographia    HLD (hyperlipidemia)    Hypertension    PONV (postoperative nausea and vomiting)     Past Surgical History:  Procedure Laterality Date   CERVICAL CONE BIOPSY     x2   ORIF ANKLE FRACTURE Left 03/22/2019   Procedure: Open treatment of left ankle trimalleolar fracture and syndesmosis injury with internal fixation;  Surgeon: Toni Arthurs, MD;  Location: [facility];  Service: Orthopedics;  Laterality: Left;    WISDOM TOOTH EXTRACTION      Social History:  reports that she quit smoking about 21 years ago. Her smoking use included cigarettes. She has never  used smokeless tobacco. She reports current alcohol use. She reports that she does not use drugs.  Allergies:  Allergies  Allergen Reactions   Sulfa Antibiotics Hives and Other (See Comments)    Medications Prior to Admission  Medication Sig Dispense Refill   hydrochlorothiazide (HYDRODIURIL) 25 MG tablet Take 1 tablet (25 mg total) by mouth daily. 90 tablet 1   levonorgestrel-ethinyl estradiol (ALESSE) 0.1-20 MG-MCG tablet Take 1 tablet by mouth daily.     Multiple Vitamins-Minerals (MULTI-VITAMIN GUMMIES PO) Take 2 tablets by mouth daily.     pravastatin (PRAVACHOL) 10 MG tablet TAKE 1 TABLET BY MOUTH EVERY DAY 90 tablet 0     Physical Exam: Blood pressure (!) 146/92, pulse 73, temperature 98.4 F (36.9 C), temperature source Oral, resp. rate 18, height 5' 7.5" (1.715 m), weight 99.3 kg, last menstrual period 02/08/2024, SpO2 100%. General: pleasant, WD, WN white female who is laying in bed in NAD HEENT: head is normocephalic, atraumatic.  Sclera are noninjected.  PERRL.  Ears and nose without any masses or lesions.  Mouth is pink and moist Heart: regular, rate, and rhythm.  Normal s1,s2. No obvious murmurs, gallops, or rubs noted.  Lungs: CTAB, no wheezes, rhonchi, or rales noted.  Respiratory effort nonlabored Abd: soft, NT, ND, +BS, no masses, hernias, or organomegaly Psych: A&Ox3 with an appropriate  affect.   Results for orders placed or performed during the hospital encounter of 02/26/24 (from the past 48 hours)  Lipase, blood     Status: None   Collection Time: 02/26/24  5:49 PM  Result Value Ref Range   Lipase 27 11 - 51 U/L    Comment: Performed at [facility], [address], [place], [place] [address]  Comprehensive metabolic panel     Status: Abnormal   Collection Time: 02/26/24  5:49 PM  Result Value Ref Range   Sodium 137 135 - 145 mmol/L   Potassium 2.8 (L) 3.5 - 5.1 mmol/L   Chloride 99 98 - 111 mmol/L   CO2 22 22 - 32 mmol/L   Glucose, Bld 125 (H) 70  - 99 mg/dL    Comment: Glucose reference range applies only to samples taken after fasting for at least 8 hours.   BUN 7 6 - 20 mg/dL   Creatinine, Ser 6.04 0.44 - 1.00 mg/dL   Calcium 9.5 8.9 - 54.0 mg/dL   Total Protein 8.2 (H) 6.5 - 8.1 g/dL   Albumin 4.1 3.5 - 5.0 g/dL   AST 981 (H) 15 - 41 U/L   ALT 243 (H) 0 - 44 U/L   Alkaline Phosphatase 122 38 - 126 U/L   Total Bilirubin 8.5 (H) 0.0 - 1.2 mg/dL   GFR, Estimated >19 >14 mL/min    Comment: (NOTE) Calculated using the CKD-EPI Creatinine Equation (2021)    Anion gap 16 (H) 5 - 15    Comment: Performed at [facility], [address], [place], [place] [address]  CBC     Status: Abnormal   Collection Time: 02/26/24  5:49 PM  Result Value Ref Range   WBC 10.2 4.0 - 10.5 K/uL   RBC 5.13 (H) 3.87 - 5.11 MIL/uL   Hemoglobin 15.4 (H) 12.0 - 15.0 g/dL   HCT 62.1 30.8 - 65.7 %   MCV 88.3 80.0 - 100.0 fL   MCH 30.0 26.0 - 34.0 pg   MCHC 34.0 30.0 - 36.0 g/dL   RDW 84.6 96.2 - 95.2 %   Platelets 410 (H) 150 - 400 K/uL   nRBC 0.0 0.0 - 0.2 %    Comment: Performed at [facility], [address], [place], [place] [address]  Urinalysis, Routine w reflex microscopic -Urine, Clean Catch     Status: Abnormal   Collection Time: 02/26/24  5:49 PM  Result Value Ref Range   Color, Urine AMBER (A) YELLOW    Comment: BIOCHEMICALS MAY BE AFFECTED BY COLOR   APPearance CLOUDY (A) CLEAR   Specific Gravity, Urine 1.021 1.005 - 1.030   pH 5.0 5.0 - 8.0   Glucose, UA NEGATIVE NEGATIVE mg/dL   Hgb urine dipstick NEGATIVE NEGATIVE   Bilirubin Urine MODERATE (A) NEGATIVE   Ketones, ur 20 (A) NEGATIVE mg/dL   Protein, ur 30 (A) NEGATIVE mg/dL   Nitrite NEGATIVE NEGATIVE   Leukocytes,Ua SMALL (A) NEGATIVE   RBC / HPF 6-10 0 - 5 RBC/hpf   WBC, UA >50 0 - 5 WBC/hpf   Bacteria, UA RARE (A) NONE SEEN   Squamous Epithelial / HPF >50 0 - 5 /HPF   Mucus PRESENT    Non Squamous Epithelial 0-5 (A) NONE SEEN    Comment: Performed at [facility], [address], [place], [place] [address]  hCG, serum, qualitative     Status: None   Collection Time: 02/26/24  5:49 PM  Result Value Ref Range   Preg, Serum NEGATIVE NEGATIVE    Comment:        THE SENSITIVITY OF THIS METHODOLOGY IS >10 mIU/mL. Performed at [facility], [address], [place], [place] [address]   Bilirubin, fractionated(tot/dir/indir)     Status: Abnormal   Collection Time: 02/26/24  5:49 PM  Result Value Ref Range   Total Bilirubin 8.4 (H) 0.0 - 1.2 mg/dL   Bilirubin, Direct 5.2 (H) 0.0 - 0.2 mg/dL   Indirect Bilirubin 3.2 (H) 0.3 - 0.9 mg/dL    Comment: Performed at [facility], [address] [place], [place] [address]  Hepatitis panel, acute     Status: None   Collection Time: 02/26/24 10:17 PM  Result Value Ref Range   Hepatitis B Surface Ag NON REACTIVE NON REACTIVE   HCV Ab NON REACTIVE NON REACTIVE    Comment: (NOTE) Nonreactive HCV antibody screen is consistent with no HCV infections,  unless recent infection is suspected or other evidence exists to indicate HCV infection.     Hep A IgM NON REACTIVE NON REACTIVE   Hep B C IgM NON REACTIVE NON REACTIVE    Comment: Performed at [facility], [address] [place], [place] [address]  Mononucleosis screen     Status: None   Collection Time: 02/26/24 10:17 PM  Result Value Ref Range   Mono Screen NEGATIVE NEGATIVE    Comment: Performed at [facility], [address], [place], [place] [address]  Magnesium     Status: None   Collection Time: 02/26/24 10:17 PM  Result Value Ref Range   Magnesium 2.4 1.7 - 2.4 mg/dL    Comment: Performed at [facility], [address] [place], [place] [address]  Urine Culture (for pregnant, neutropenic or urologic patients or patients with an indwelling urinary catheter)     Status: Abnormal (Preliminary result)   Collection Time: 02/27/24  4:13 AM   Specimen: Urine, Clean Catch  Result Value Ref Range   Specimen  Description URINE, CLEAN CATCH    Special Requests      NONE Performed at [facility], [address] [place], [place] [address]    Culture MULTIPLE SPECIES PRESENT, SUGGEST RECOLLECTION (A)    Report Status PENDING   Comprehensive metabolic panel     Status: Abnormal   Collection Time: 02/27/24  9:02 AM  Result Value Ref Range   Sodium 138 135 - 145 mmol/L   Potassium 3.0 (L) 3.5 - 5.1 mmol/L   Chloride 102 98 - 111 mmol/L   CO2 27 22 - 32 mmol/L   Glucose, Bld 88 70 - 99 mg/dL    Comment: Glucose reference range applies only to samples taken after fasting for at least 8 hours.   BUN <5 (L) 6 - 20 mg/dL   Creatinine, Ser 5.78 0.44 - 1.00 mg/dL   Calcium 8.9 8.9 - 46.9 mg/dL   Total Protein 7.1 6.5 - 8.1 g/dL   Albumin 3.4 (L) 3.5 - 5.0 g/dL   AST 629 (H) 15 - 41 U/L   ALT 204 (H) 0 - 44 U/L   Alkaline Phosphatase 102 38 - 126 U/L   Total Bilirubin 3.6 (H) 0.0 - 1.2 mg/dL   GFR, Estimated >52 >84 mL/min    Comment: (NOTE) Calculated using the CKD-EPI Creatinine Equation (2021)    Anion gap 9 5 - 15    Comment:  Performed at [facility], [address], [place], [place] [address]  Acetaminophen level     Status: Abnormal   Collection Time: 02/27/24  9:02 AM  Result Value Ref Range   Acetaminophen (Tylenol), Serum <10 (L) 10 - 30 ug/mL    Comment: (NOTE) Therapeutic concentrations vary significantly. A range of 10-30 ug/mL  may be an effective concentration for many patients. However, some  are best treated at concentrations outside of this range. Acetaminophen concentrations >150 ug/mL at 4 hours after ingestion  and >50 ug/mL at 12 hours after ingestion are often associated with  toxic reactions.  Performed at [facility], [address] [place], [place] [address]   Comprehensive metabolic panel with GFR     Status: Abnormal   Collection Time: 02/28/24  6:56 AM  Result Value Ref Range   Sodium 140 135 - 145 mmol/L   Potassium 3.3 (L) 3.5 - 5.1  mmol/L   Chloride 102 98 - 111 mmol/L   CO2 27 22 - 32 mmol/L   Glucose, Bld 87 70 - 99 mg/dL    Comment: Glucose reference range applies only to samples taken after fasting for at least 8 hours.   BUN 6 6 - 20 mg/dL   Creatinine, Ser 0.98 0.44 - 1.00 mg/dL   Calcium 9.0 8.9 - 11.9 mg/dL   Total Protein 7.0 6.5 - 8.1 g/dL   Albumin 3.2 (L) 3.5 - 5.0 g/dL   AST 95 (H) 15 - 41 U/L   ALT 180 (H) 0 - 44 U/L   Alkaline Phosphatase 95 38 - 126 U/L   Total Bilirubin 1.8 (H) 0.0 - 1.2 mg/dL   GFR, Estimated >14 >78 mL/min    Comment: (NOTE) Calculated using the CKD-EPI Creatinine Equation (2021)    Anion gap 11 5 - 15    Comment: Performed at [facility], [address], [place], [place] [address]  CBC     Status: None   Collection Time: 02/28/24  6:56 AM  Result Value Ref Range   WBC 5.1 4.0 - 10.5 K/uL   RBC 4.39 3.87 - 5.11 MIL/uL   Hemoglobin 13.2 12.0 - 15.0 g/dL   HCT 13.0 86.5 - 78.4 %   MCV 86.8 80.0 - 100.0 fL   MCH 30.1 26.0 - 34.0 pg   MCHC 34.6 30.0 - 36.0 g/dL   RDW 69.6 29.5 - 28.4 %   Platelets 321 150 - 400 K/uL   nRBC 0.0 0.0 - 0.2 %    Comment: Performed at [facility], [address] [place], [place] [address]   MR ABDOMEN MRCP W WO CONTAST Result Date: 02/27/2024 CLINICAL DATA:  Jaundice. Biliary obstruction suspected. Gallstones on ultrasound. EXAM: MRI ABDOMEN WITHOUT AND WITH CONTRAST (INCLUDING MRCP) TECHNIQUE: Multiplanar multisequence MR imaging of the abdomen was performed both before and after the administration of intravenous contrast. Heavily T2-weighted images of the biliary and pancreatic ducts were obtained, and three-dimensional MRCP images were rendered by post processing. CONTRAST:  10mL GADAVIST GADOBUTROL 1 MMOL/ML IV SOLN COMPARISON:  Right upper quadrant abdominal ultrasound 02/26/2024. No other comparison studies. FINDINGS: Technical note: Despite efforts by the technologist and patient, mild motion artifact is present on today's  exam and could not be eliminated. This reduces exam sensitivity and specificity. Lower chest:  The visualized lower chest appears unremarkable. Hepatobiliary: The liver has a non cirrhotic morphology. There is no significant steatosis, focal abnormality or abnormal enhancement. As seen  on ultrasound, there are small gallstones and possible small polyps. The gallbladder is not significantly distended, but there is mild pericholecystic fluid. Sonographic Eulah Pont sign was reported as absent on earlier ultrasound. No evidence of biliary dilatation or choledocholithiasis. Pancreas: Unremarkable. No pancreatic ductal dilatation or surrounding inflammatory changes. Spleen: Normal in size without focal abnormality. Adrenals/Urinary Tract: Both adrenal glands appear normal. No evidence of renal mass, hydronephrosis or perinephric soft tissue stranding. Stomach/Bowel: The stomach appears unremarkable for its degree of distension. No evidence of bowel wall thickening, distention or surrounding inflammatory change. Vascular/Lymphatic: There are no enlarged abdominal lymph nodes. No significant vascular findings. The portal, superior mesenteric and splenic veins are patent. Other: No ascites. Musculoskeletal: No acute or significant osseous findings. IMPRESSION: 1. Cholelithiasis with mild pericholecystic fluid. Sonographic Eulah Pont sign was reported as absent on recent ultrasound. Findings are equivocal for acute cholecystitis. Consider nuclear medicine HIDA scan for further evaluation. 2. No evidence of biliary dilatation or choledocholithiasis. The liver appears normal. 3. No other significant findings. Electronically Signed   By: Carey Bullocks M.D.   On: 02/27/2024 17:15   MR 3D Recon At Scanner Result Date: 02/27/2024 CLINICAL DATA:  Jaundice. Biliary obstruction suspected. Gallstones on ultrasound. EXAM: MRI ABDOMEN WITHOUT AND WITH CONTRAST (INCLUDING MRCP) TECHNIQUE: Multiplanar multisequence MR imaging of the abdomen  was performed both before and after the administration of intravenous contrast. Heavily T2-weighted images of the biliary and pancreatic ducts were obtained, and three-dimensional MRCP images were rendered by post processing. CONTRAST:  10mL GADAVIST GADOBUTROL 1 MMOL/ML IV SOLN COMPARISON:  Right upper quadrant abdominal ultrasound 02/26/2024. No other comparison studies. FINDINGS: Technical note: Despite efforts by the technologist and patient, mild motion artifact is present on today's exam and could not be eliminated. This reduces exam sensitivity and specificity. Lower chest:  The visualized lower chest appears unremarkable. Hepatobiliary: The liver has a non cirrhotic morphology. There is no significant steatosis, focal abnormality or abnormal enhancement. As seen on ultrasound, there are small gallstones and possible small polyps. The gallbladder is not significantly distended, but there is mild pericholecystic fluid. Sonographic Eulah Pont sign was reported as absent on earlier ultrasound. No evidence of biliary dilatation or choledocholithiasis. Pancreas: Unremarkable. No pancreatic ductal dilatation or surrounding inflammatory changes. Spleen: Normal in size without focal abnormality. Adrenals/Urinary Tract: Both adrenal glands appear normal. No evidence of renal mass, hydronephrosis or perinephric soft tissue stranding. Stomach/Bowel: The stomach appears unremarkable for its degree of distension. No evidence of bowel wall thickening, distention or surrounding inflammatory change. Vascular/Lymphatic: There are no enlarged abdominal lymph nodes. No significant vascular findings. The portal, superior mesenteric and splenic veins are patent. Other: No ascites. Musculoskeletal: No acute or significant osseous findings. IMPRESSION: 1. Cholelithiasis with mild pericholecystic fluid. Sonographic Eulah Pont sign was reported as absent on recent ultrasound. Findings are equivocal for acute cholecystitis. Consider nuclear  medicine HIDA scan for further evaluation. 2. No evidence of biliary dilatation or choledocholithiasis. The liver appears normal. 3. No other significant findings. Electronically Signed   By: Carey Bullocks M.D.   On: 02/27/2024 17:15   US ABDOMEN LIMITED RUQ (LIVER/GB) Result Date: 02/26/2024 CLINICAL DATA:  Jaundice. EXAM: ULTRASOUND ABDOMEN LIMITED RIGHT UPPER QUADRANT COMPARISON:  None Available. FINDINGS: Gallbladder: A 4.7 mm nonshadowing echogenic gallbladder polyp is seen along the nondependent wall of the gallbladder lumen. Shadowing echogenic gallstones are also seen (the largest measures 5.6 mm). The gallbladder wall measures 3.3 mm in thickness. No sonographic Murphy sign noted by sonographer. Common bile duct: Diameter: 7.6  mm Liver: No focal lesion identified. Diffusely increased echogenicity of the liver parenchyma is noted. Portal vein is patent on color Doppler imaging with normal direction of blood flow towards the liver. Other: None. IMPRESSION: 1. Cholelithiasis. 2. 4.7 mm gallbladder polyp, likely benign. No additional follow-up is recommended. This recommendation follows ACR consensus guidelines: White Paper of the ACR Incidental Findings Committee II on Gallbladder and Biliary Findings. J Am Coll Radiol 2013:;10:953-956. 3. Hepatic steatosis. Electronically Signed   By: Aram Candela M.D.   On: 02/26/2024 21:59      Assessment/Plan Biliary colic, choledocholithiasis The patient has been seen, examined, labs, vitals, imaging, and chart reviewed.  The patient likely presented with choledocholithiasis that has passed, and her labs and symptoms are improving.  We will plan to proceed with a lap chole tomorrow.  If this goes well then she can likely go home later tomorrow afternoon.  Briefly discussed this plan with her and her husband at bedside.  They are agreeable.     FEN - regular, NPO p MN VTE - per primary, ok from our standpoint ID - none currently, Rocephin on call to  OR   HTN HLD  I reviewed Consultant GI notes, hospitalist notes, last 24 h vitals and pain scores, last 48 h intake and output, last 24 h labs and trends, and last 24 h imaging results.  Letha Cape, [facility] 02/28/2024, 11:45 AM Please see Amion for pager number during day hours 7:00am-4:30pm or 7:00am -11:30am on weekends

## 2024-02-28 NOTE — Progress Notes (Signed)
 Subjective: Feeling well.  No complaints.  Objective: Vital signs in last 24 hours: Temp:  [98.4 F (36.9 C)] 98.4 F (36.9 C) (03/30 0734) Pulse Rate:  [70-74] 73 (03/30 0734) Resp:  [18] 18 (03/29 2126) BP: (143-150)/(77-92) 146/92 (03/30 0734) SpO2:  [100 %] 100 % (03/30 0734) Last BM Date : 02/27/24  Intake/Output from previous day: 03/29 0701 - 03/30 0700 In: 240 [P.O.:240] Out: 0  Intake/Output this shift: No intake/output data recorded.  General appearance: alert and no distress GI: soft, non-tender; bowel sounds normal; no masses,  no organomegaly  Lab Results: Recent Labs    02/26/24 1749 02/28/24 0656  WBC 10.2 5.1  HGB 15.4* 13.2  HCT 45.3 38.1  PLT 410* 321   BMET Recent Labs    02/26/24 1749 02/27/24 0902 02/28/24 0656  NA 137 138 140  K 2.8* 3.0* 3.3*  CL 99 102 102  CO2 22 27 27   GLUCOSE 125* 88 87  BUN 7 <5* 6  CREATININE 0.87 0.66 0.73  CALCIUM 9.5 8.9 9.0   LFT Recent Labs    02/26/24 1749 02/27/24 0902 02/28/24 0656  PROT 8.2*   < > 7.0  ALBUMIN 4.1   < > 3.2*  AST 159*   < > 95*  ALT 243*   < > 180*  ALKPHOS 122   < > 95  BILITOT 8.4*  8.5*   < > 1.8*  BILIDIR 5.2*  --   --   IBILI 3.2*  --   --    < > = values in this interval not displayed.   PT/INR No results for input(s): "LABPROT", "INR" in the last 72 hours. Hepatitis Panel Recent Labs    02/26/24 2217  HEPBSAG NON REACTIVE  HCVAB NON REACTIVE  HEPAIGM NON REACTIVE  HEPBIGM NON REACTIVE   C-Diff No results for input(s): "CDIFFTOX" in the last 72 hours. Fecal Lactopherrin No results for input(s): "FECLLACTOFRN" in the last 72 hours.  Studies/Results: MR ABDOMEN MRCP W WO CONTAST Result Date: 02/27/2024 CLINICAL DATA:  Jaundice. Biliary obstruction suspected. Gallstones on ultrasound. EXAM: MRI ABDOMEN WITHOUT AND WITH CONTRAST (INCLUDING MRCP) TECHNIQUE: Multiplanar multisequence MR imaging of the abdomen was performed both before and after the administration  of intravenous contrast. Heavily T2-weighted images of the biliary and pancreatic ducts were obtained, and three-dimensional MRCP images were rendered by post processing. CONTRAST:  10mL GADAVIST GADOBUTROL 1 MMOL/ML IV SOLN COMPARISON:  Right upper quadrant abdominal ultrasound 02/26/2024. No other comparison studies. FINDINGS: Technical note: Despite efforts by the technologist and patient, mild motion artifact is present on today's exam and could not be eliminated. This reduces exam sensitivity and specificity. Lower chest:  The visualized lower chest appears unremarkable. Hepatobiliary: The liver has a non cirrhotic morphology. There is no significant steatosis, focal abnormality or abnormal enhancement. As seen on ultrasound, there are small gallstones and possible small polyps. The gallbladder is not significantly distended, but there is mild pericholecystic fluid. Sonographic Eulah Pont sign was reported as absent on earlier ultrasound. No evidence of biliary dilatation or choledocholithiasis. Pancreas: Unremarkable. No pancreatic ductal dilatation or surrounding inflammatory changes. Spleen: Normal in size without focal abnormality. Adrenals/Urinary Tract: Both adrenal glands appear normal. No evidence of renal mass, hydronephrosis or perinephric soft tissue stranding. Stomach/Bowel: The stomach appears unremarkable for its degree of distension. No evidence of bowel wall thickening, distention or surrounding inflammatory change. Vascular/Lymphatic: There are no enlarged abdominal lymph nodes. No significant vascular findings. The portal, superior mesenteric and splenic veins  are patent. Other: No ascites. Musculoskeletal: No acute or significant osseous findings. IMPRESSION: 1. Cholelithiasis with mild pericholecystic fluid. Sonographic Eulah Pont sign was reported as absent on recent ultrasound. Findings are equivocal for acute cholecystitis. Consider nuclear medicine HIDA scan for further evaluation. 2. No  evidence of biliary dilatation or choledocholithiasis. The liver appears normal. 3. No other significant findings. Electronically Signed   By: Carey Bullocks M.D.   On: 02/27/2024 17:15   MR 3D Recon At Scanner Result Date: 02/27/2024 CLINICAL DATA:  Jaundice. Biliary obstruction suspected. Gallstones on ultrasound. EXAM: MRI ABDOMEN WITHOUT AND WITH CONTRAST (INCLUDING MRCP) TECHNIQUE: Multiplanar multisequence MR imaging of the abdomen was performed both before and after the administration of intravenous contrast. Heavily T2-weighted images of the biliary and pancreatic ducts were obtained, and three-dimensional MRCP images were rendered by post processing. CONTRAST:  10mL GADAVIST GADOBUTROL 1 MMOL/ML IV SOLN COMPARISON:  Right upper quadrant abdominal ultrasound 02/26/2024. No other comparison studies. FINDINGS: Technical note: Despite efforts by the technologist and patient, mild motion artifact is present on today's exam and could not be eliminated. This reduces exam sensitivity and specificity. Lower chest:  The visualized lower chest appears unremarkable. Hepatobiliary: The liver has a non cirrhotic morphology. There is no significant steatosis, focal abnormality or abnormal enhancement. As seen on ultrasound, there are small gallstones and possible small polyps. The gallbladder is not significantly distended, but there is mild pericholecystic fluid. Sonographic Eulah Pont sign was reported as absent on earlier ultrasound. No evidence of biliary dilatation or choledocholithiasis. Pancreas: Unremarkable. No pancreatic ductal dilatation or surrounding inflammatory changes. Spleen: Normal in size without focal abnormality. Adrenals/Urinary Tract: Both adrenal glands appear normal. No evidence of renal mass, hydronephrosis or perinephric soft tissue stranding. Stomach/Bowel: The stomach appears unremarkable for its degree of distension. No evidence of bowel wall thickening, distention or surrounding inflammatory  change. Vascular/Lymphatic: There are no enlarged abdominal lymph nodes. No significant vascular findings. The portal, superior mesenteric and splenic veins are patent. Other: No ascites. Musculoskeletal: No acute or significant osseous findings. IMPRESSION: 1. Cholelithiasis with mild pericholecystic fluid. Sonographic Eulah Pont sign was reported as absent on recent ultrasound. Findings are equivocal for acute cholecystitis. Consider nuclear medicine HIDA scan for further evaluation. 2. No evidence of biliary dilatation or choledocholithiasis. The liver appears normal. 3. No other significant findings. Electronically Signed   By: Carey Bullocks M.D.   On: 02/27/2024 17:15   US ABDOMEN LIMITED RUQ (LIVER/GB) Result Date: 02/26/2024 CLINICAL DATA:  Jaundice. EXAM: ULTRASOUND ABDOMEN LIMITED RIGHT UPPER QUADRANT COMPARISON:  None Available. FINDINGS: Gallbladder: A 4.7 mm nonshadowing echogenic gallbladder polyp is seen along the nondependent wall of the gallbladder lumen. Shadowing echogenic gallstones are also seen (the largest measures 5.6 mm). The gallbladder wall measures 3.3 mm in thickness. No sonographic Murphy sign noted by sonographer. Common bile duct: Diameter: 7.6 mm Liver: No focal lesion identified. Diffusely increased echogenicity of the liver parenchyma is noted. Portal vein is patent on color Doppler imaging with normal direction of blood flow towards the liver. Other: None. IMPRESSION: 1. Cholelithiasis. 2. 4.7 mm gallbladder polyp, likely benign. No additional follow-up is recommended. This recommendation follows ACR consensus guidelines: White Paper of the ACR Incidental Findings Committee II on Gallbladder and Biliary Findings. J Am Coll Radiol 2013:;10:953-956. 3. Hepatic steatosis. Electronically Signed   By: Aram Candela M.D.   On: 02/26/2024 21:59    Medications: Scheduled:  hydrochlorothiazide  25 mg Oral Daily   levonorgestrel-ethinyl estradiol  1 tablet Oral Daily  potassium  chloride  40 mEq Oral Once   Continuous:  Assessment/Plan: 1) Symptomatic cholelithiasis. 2) Abnormal liver enzymes - improving.   The patient is well.  Clinically she is stable.  Her liver enzymes are normalizing.  The MRCP was negative for any evidence of choledocholithiasis, which confirms that she passed stone(s).  There was a question of acute cholecystitis, but this is felt to be a false positive.  Plan: 1) Surgical evaluation either here in the hospital or as an outpatient. 2) Signing off.  LOS: 1 day   Davey Bergsma D 02/28/2024, 7:47 AM

## 2024-02-29 ENCOUNTER — Inpatient Hospital Stay (HOSPITAL_COMMUNITY): Admitting: Anesthesiology

## 2024-02-29 ENCOUNTER — Other Ambulatory Visit: Payer: Self-pay

## 2024-02-29 ENCOUNTER — Encounter (HOSPITAL_COMMUNITY)
Admission: EM | Disposition: A | Discharge status: Home / Self Care | Payer: Self-pay | Source: Home / Self Care | Attending: Internal Medicine

## 2024-02-29 ENCOUNTER — Encounter (HOSPITAL_COMMUNITY): Payer: Self-pay | Admitting: Internal Medicine

## 2024-02-29 ENCOUNTER — Inpatient Hospital Stay (HOSPITAL_COMMUNITY)

## 2024-02-29 DIAGNOSIS — K8060 Calculus of gallbladder and bile duct with cholecystitis, unspecified, without obstruction: Secondary | ICD-10-CM

## 2024-02-29 DIAGNOSIS — R748 Abnormal levels of other serum enzymes: Secondary | ICD-10-CM | POA: Diagnosis not present

## 2024-02-29 HISTORY — PX: CHOLECYSTECTOMY: SHX55

## 2024-02-29 LAB — COMPREHENSIVE METABOLIC PANEL WITH GFR
ALT: 156 U/L — ABNORMAL HIGH (ref 0–44)
AST: 84 U/L — ABNORMAL HIGH (ref 15–41)
Albumin: 3.4 g/dL — ABNORMAL LOW (ref 3.5–5.0)
Alkaline Phosphatase: 92 U/L (ref 38–126)
Anion gap: 14 (ref 5–15)
BUN: 10 mg/dL (ref 6–20)
CO2: 21 mmol/L — ABNORMAL LOW (ref 22–32)
Calcium: 8.9 mg/dL (ref 8.9–10.3)
Chloride: 103 mmol/L (ref 98–111)
Creatinine, Ser: 0.67 mg/dL (ref 0.44–1.00)
GFR, Estimated: >60 mL/min (ref >60)
Glucose, Bld: 128 mg/dL — ABNORMAL HIGH (ref 70–99)
Potassium: 3.5 mmol/L (ref 3.5–5.1)
Sodium: 138 mmol/L (ref 135–145)
Total Bilirubin: 1.3 mg/dL — ABNORMAL HIGH (ref 0.0–1.2)
Total Protein: 7.2 g/dL (ref 6.5–8.1)

## 2024-02-29 SURGERY — LAPAROSCOPIC CHOLECYSTECTOMY
Anesthesia: General | Site: Abdomen

## 2024-02-29 MED ORDER — ORAL CARE MOUTH RINSE
15.0000 mL | Freq: Once | OROMUCOSAL | Status: AC
  Administered 2024-02-29: 15 mL via OROMUCOSAL

## 2024-02-29 MED ORDER — DEXAMETHASONE SODIUM PHOSPHATE 10 MG/ML IJ SOLN
INTRAMUSCULAR | Status: DC | PRN
  Administered 2024-02-29: 4 mg via INTRAVENOUS

## 2024-02-29 MED ORDER — PROPOFOL 10 MG/ML IV BOLUS
INTRAVENOUS | Status: DC | PRN
Start: 2024-02-29 — End: 2024-02-29
  Administered 2024-02-29: 150 mg via INTRAVENOUS
  Administered 2024-02-29: 50 mg via INTRAVENOUS

## 2024-02-29 MED ORDER — IBUPROFEN 200 MG PO TABS: 600.0000 mg | ORAL_TABLET | Freq: Three times a day (TID) | ORAL | Status: DC | PRN

## 2024-02-29 MED ORDER — OXYCODONE HCL 5 MG PO TABS
5.0000 mg | ORAL_TABLET | Freq: Once | ORAL | Status: AC | PRN
  Administered 2024-02-29: 5 mg via ORAL

## 2024-02-29 MED ORDER — MORPHINE SULFATE (PF) 2 MG/ML IV SOLN
1.0000 mg | INTRAVENOUS | Status: DC | PRN
  Administered 2024-02-29: 1 mg via INTRAVENOUS
  Filled 2024-02-29: qty 1

## 2024-02-29 MED ORDER — PROPOFOL 500 MG/50ML IV EMUL
INTRAVENOUS | Status: DC | PRN
  Administered 2024-02-29: 125 ug/kg/min via INTRAVENOUS

## 2024-02-29 MED ORDER — ACETAMINOPHEN 10 MG/ML IV SOLN
INTRAVENOUS | Status: DC | PRN
  Administered 2024-02-29: 1000 mg via INTRAVENOUS

## 2024-02-29 MED ORDER — IBUPROFEN 600 MG PO TABS
600.0000 mg | ORAL_TABLET | Freq: Three times a day (TID) | ORAL | Status: DC
  Administered 2024-02-29: 600 mg via ORAL
  Filled 2024-02-29: qty 1

## 2024-02-29 MED ORDER — OXYCODONE HCL 5 MG PO TABS: 5.0000 mg | ORAL_TABLET | ORAL | Status: DC | PRN

## 2024-02-29 MED ORDER — ONDANSETRON HCL 4 MG/2ML IJ SOLN
INTRAMUSCULAR | Status: DC | PRN
  Administered 2024-02-29: 4 mg via INTRAVENOUS

## 2024-02-29 MED ORDER — ROCURONIUM BROMIDE 10 MG/ML (PF) SYRINGE
PREFILLED_SYRINGE | INTRAVENOUS | Status: DC | PRN
  Administered 2024-02-29: 50 mg via INTRAVENOUS

## 2024-02-29 MED ORDER — ACETAMINOPHEN 10 MG/ML IV SOLN: 1000.0000 mg | Freq: Once | INTRAVENOUS | Status: DC | PRN

## 2024-02-29 MED ORDER — DEXMEDETOMIDINE HCL IN NACL 80 MCG/20ML IV SOLN
INTRAVENOUS | Status: DC | PRN
  Administered 2024-02-29: 12 ug via INTRAVENOUS

## 2024-02-29 MED ORDER — MIDAZOLAM HCL 2 MG/2ML IJ SOLN
INTRAMUSCULAR | Status: AC
  Filled 2024-02-29: qty 2

## 2024-02-29 MED ORDER — HYDROMORPHONE HCL 1 MG/ML IJ SOLN
INTRAMUSCULAR | Status: DC | PRN
Start: 2024-02-29 — End: 2024-02-29
  Administered 2024-02-29: .5 mg via INTRAVENOUS

## 2024-02-29 MED ORDER — ONDANSETRON HCL 4 MG/2ML IJ SOLN
4.0000 mg | Freq: Four times a day (QID) | INTRAMUSCULAR | Status: DC | PRN
  Administered 2024-02-29: 4 mg via INTRAVENOUS
  Filled 2024-02-29: qty 2

## 2024-02-29 MED ORDER — OXYCODONE HCL 5 MG/5ML PO SOLN: 5.0000 mg | Freq: Once | ORAL | Status: AC | PRN

## 2024-02-29 MED ORDER — 0.9 % SODIUM CHLORIDE (POUR BTL) OPTIME
TOPICAL | Status: DC | PRN
  Administered 2024-02-29: 1000 mL

## 2024-02-29 MED ORDER — ONDANSETRON HCL 4 MG/2ML IJ SOLN: 4.0000 mg | Freq: Once | INTRAMUSCULAR | Status: DC | PRN

## 2024-02-29 MED ORDER — FENTANYL CITRATE (PF) 250 MCG/5ML IJ SOLN
INTRAMUSCULAR | Status: AC
  Filled 2024-02-29: qty 5

## 2024-02-29 MED ORDER — INDOCYANINE GREEN 25 MG IV SOLR
1.2500 mg | Freq: Once | INTRAVENOUS | Status: AC
  Administered 2024-02-29: 1.25 mg via TOPICAL
  Filled 2024-02-29: qty 10

## 2024-02-29 MED ORDER — PROPOFOL 10 MG/ML IV BOLUS
INTRAVENOUS | Status: AC
  Filled 2024-02-29: qty 20

## 2024-02-29 MED ORDER — SUGAMMADEX SODIUM 200 MG/2ML IV SOLN
INTRAVENOUS | Status: DC | PRN
  Administered 2024-02-29: 200 mg via INTRAVENOUS

## 2024-02-29 MED ORDER — LIDOCAINE 2% (20 MG/ML) 5 ML SYRINGE
INTRAMUSCULAR | Status: DC | PRN
  Administered 2024-02-29: 100 mg via INTRAVENOUS

## 2024-02-29 MED ORDER — OXYCODONE HCL 5 MG PO TABS
ORAL_TABLET | ORAL | Status: AC
  Filled 2024-02-29: qty 1

## 2024-02-29 MED ORDER — LACTATED RINGERS IV SOLN: INTRAVENOUS | Status: DC

## 2024-02-29 MED ORDER — POTASSIUM CHLORIDE CRYS ER 20 MEQ PO TBCR: 20.0000 meq | EXTENDED_RELEASE_TABLET | Freq: Two times a day (BID) | ORAL | 0 refills | Status: DC

## 2024-02-29 MED ORDER — SODIUM CHLORIDE 0.9 % IR SOLN
Status: DC | PRN
  Administered 2024-02-29: 1000 mL

## 2024-02-29 MED ORDER — OXYCODONE HCL 5 MG PO TABS: 5.0000 mg | ORAL_TABLET | ORAL | 0 refills | Status: DC | PRN

## 2024-02-29 MED ORDER — CHLORHEXIDINE GLUCONATE 0.12 % MT SOLN
15.0000 mL | Freq: Once | OROMUCOSAL | Status: AC
  Filled 2024-02-29: qty 15

## 2024-02-29 MED ORDER — HYDROMORPHONE HCL 1 MG/ML IJ SOLN
INTRAMUSCULAR | Status: AC
Start: 2024-02-29 — End: ?
  Filled 2024-02-29: qty 0.5

## 2024-02-29 MED ORDER — BUPIVACAINE-EPINEPHRINE (PF) 0.25% -1:200000 IJ SOLN
INTRAMUSCULAR | Status: AC
  Filled 2024-02-29: qty 30

## 2024-02-29 MED ORDER — ACETAMINOPHEN 500 MG PO TABS: 1000.0000 mg | ORAL_TABLET | Freq: Four times a day (QID) | ORAL | 0 refills | Status: DC

## 2024-02-29 MED ORDER — FENTANYL CITRATE (PF) 250 MCG/5ML IJ SOLN
INTRAMUSCULAR | Status: DC | PRN
Start: 2024-02-29 — End: 2024-02-29
  Administered 2024-02-29 (×3): 50 ug via INTRAVENOUS
  Administered 2024-02-29: 100 ug via INTRAVENOUS

## 2024-02-29 MED ORDER — BUPIVACAINE-EPINEPHRINE 0.25% -1:200000 IJ SOLN
INTRAMUSCULAR | Status: DC | PRN
  Administered 2024-02-29: 10 mL

## 2024-02-29 MED ORDER — ONDANSETRON 4 MG PO TBDP: 4.0000 mg | ORAL_TABLET | Freq: Three times a day (TID) | ORAL | 0 refills | Status: DC | PRN

## 2024-02-29 MED ORDER — FENTANYL CITRATE (PF) 100 MCG/2ML IJ SOLN
25.0000 ug | INTRAMUSCULAR | Status: DC | PRN
  Administered 2024-02-29: 50 ug via INTRAVENOUS

## 2024-02-29 MED ORDER — FENTANYL CITRATE (PF) 100 MCG/2ML IJ SOLN
INTRAMUSCULAR | Status: AC
  Filled 2024-02-29: qty 2

## 2024-02-29 MED ORDER — MIDAZOLAM HCL 2 MG/2ML IJ SOLN
INTRAMUSCULAR | Status: DC | PRN
  Administered 2024-02-29: 2 mg via INTRAVENOUS

## 2024-02-29 MED ORDER — ENOXAPARIN SODIUM 40 MG/0.4ML IJ SOSY: 40.0000 mg | PREFILLED_SYRINGE | INTRAMUSCULAR | Status: DC

## 2024-02-29 MED ORDER — SCOPOLAMINE 1 MG/3DAYS TD PT72
MEDICATED_PATCH | TRANSDERMAL | Status: AC
  Filled 2024-02-29: qty 1

## 2024-02-29 MED ORDER — ACETAMINOPHEN 500 MG PO TABS
1000.0000 mg | ORAL_TABLET | Freq: Four times a day (QID) | ORAL | Status: DC
Start: 2024-02-29 — End: 2024-02-29
  Administered 2024-02-29: 1000 mg via ORAL
  Filled 2024-02-29 (×2): qty 2

## 2024-02-29 SURGICAL SUPPLY — 39 items
APPLIER CLIP 5 13 M/L LIGAMAX5 (MISCELLANEOUS) ×1 IMPLANT
BAG COUNTER SPONGE SURGICOUNT (BAG) ×1 IMPLANT
CANISTER SUCT 3000ML PPV (MISCELLANEOUS) ×1 IMPLANT
CHLORAPREP W/TINT 26 (MISCELLANEOUS) ×1 IMPLANT
CLIP APPLIE 5 13 M/L LIGAMAX5 (MISCELLANEOUS) ×1 IMPLANT
COVER MAYO STAND STRL (DRAPES) IMPLANT
COVER SURGICAL LIGHT HANDLE (MISCELLANEOUS) ×1 IMPLANT
DERMABOND ADVANCED .7 DNX12 (GAUZE/BANDAGES/DRESSINGS) ×1 IMPLANT
DRAPE C-ARM 42X120 X-RAY (DRAPES) IMPLANT
ELECT REM PT RETURN 9FT ADLT (ELECTROSURGICAL) ×1 IMPLANT
ELECTRODE REM PT RTRN 9FT ADLT (ELECTROSURGICAL) ×1 IMPLANT
ENDOLOOP SUT PDS II 0 18 (SUTURE) IMPLANT
GLOVE BIO SURGEON STRL SZ7 (GLOVE) ×1 IMPLANT
GLOVE BIOGEL PI IND STRL 7.5 (GLOVE) ×1 IMPLANT
GOWN STRL REUS W/ TWL LRG LVL3 (GOWN DISPOSABLE) ×3 IMPLANT
GRASPER SUT TROCAR 14GX15 (MISCELLANEOUS) ×1 IMPLANT
IRRIG SUCT STRYKERFLOW 2 WTIP (MISCELLANEOUS) ×1 IMPLANT
IRRIGATION SUCT STRKRFLW 2 WTP (MISCELLANEOUS) ×1 IMPLANT
KIT BASIN OR (CUSTOM PROCEDURE TRAY) ×1 IMPLANT
KIT IMAGING PINPOINTPAQ (MISCELLANEOUS) IMPLANT
KIT TURNOVER KIT B (KITS) ×1 IMPLANT
NS IRRIG 1000ML POUR BTL (IV SOLUTION) ×1 IMPLANT
PAD ARMBOARD POSITIONER FOAM (MISCELLANEOUS) ×1 IMPLANT
POUCH RETRIEVAL ECOSAC 10 (ENDOMECHANICALS) ×1 IMPLANT
SCISSORS LAP 5X35 DISP (ENDOMECHANICALS) ×1 IMPLANT
SET CHOLANGIOGRAPH 5 50 .035 (SET/KITS/TRAYS/PACK) IMPLANT
SET TUBE SMOKE EVAC HIGH FLOW (TUBING) ×1 IMPLANT
SLEEVE Z-THREAD 5X100MM (TROCAR) ×2 IMPLANT
SPECIMEN JAR SMALL (MISCELLANEOUS) ×1 IMPLANT
STRIP CLOSURE SKIN 1/2X4 (GAUZE/BANDAGES/DRESSINGS) ×1 IMPLANT
SUT MNCRL AB 4-0 PS2 18 (SUTURE) ×1 IMPLANT
SUT VICRYL 0 UR6 27IN ABS (SUTURE) ×1 IMPLANT
TOWEL GREEN STERILE (TOWEL DISPOSABLE) ×1 IMPLANT
TOWEL GREEN STERILE FF (TOWEL DISPOSABLE) ×1 IMPLANT
TRAY LAPAROSCOPIC MC (CUSTOM PROCEDURE TRAY) ×1 IMPLANT
TROCAR BALLN 12MMX100 BLUNT (TROCAR) ×1 IMPLANT
TROCAR Z-THREAD OPTICAL 5X100M (TROCAR) ×1 IMPLANT
WARMER LAPAROSCOPE (MISCELLANEOUS) ×1 IMPLANT
WATER STERILE IRR 1000ML POUR (IV SOLUTION) ×1 IMPLANT

## 2024-02-29 NOTE — Plan of Care (Signed)

## 2024-02-29 NOTE — Anesthesia Postprocedure Evaluation (Signed)
 Anesthesia Post Note  Patient: Rhiley Tarver  Procedure(s) Performed: LAPAROSCOPIC CHOLECYSTECTOMY (Abdomen) INDOCYANINE GREEN FLUORESCENCE IMAGING (ICG) (Abdomen)     Patient location during evaluation: PACU Anesthesia Type: General Level of consciousness: awake and alert Pain management: pain level controlled Vital Signs Assessment: post-procedure vital signs reviewed and stable Respiratory status: spontaneous breathing, nonlabored ventilation, respiratory function stable and patient connected to nasal cannula oxygen Cardiovascular status: blood pressure returned to baseline and stable Postop Assessment: no apparent nausea or vomiting Anesthetic complications: no  No notable events documented.  Last Vitals:  Vitals:   02/29/24 1315 02/29/24 1334  BP: (!) 145/86 (!) 146/83  Pulse: 72 74  Resp: 14 18  Temp: (!) 36.4 C (!) 36.3 C  SpO2: 100% 100%    Last Pain:  Vitals:   02/29/24 1334  TempSrc: Oral  PainSc:                  Shelton Silvas

## 2024-02-29 NOTE — Interval H&P Note (Signed)
 History and Physical Interval Note:  02/29/2024 10:52 AM  [name]  has presented today for surgery, with the diagnosis of cholecystitis.  The various methods of treatment have been discussed with the patient and family. After consideration of risks, benefits and other options for treatment, the patient has consented to  Procedure(s): LAPAROSCOPIC CHOLECYSTECTOMY (N/A) INDOCYANINE GREEN FLUORESCENCE IMAGING (ICG) (N/A) as a surgical intervention.  The patient's history has been reviewed, patient examined, no change in status, stable for surgery.  I have reviewed the patient's chart and labs.  Questions were answered to the patient's satisfaction.     Kylie Cook

## 2024-02-29 NOTE — Anesthesia Preprocedure Evaluation (Signed)
 Anesthesia Evaluation  Patient identified by MRN, date of birth, ID band Patient awake    Reviewed: Allergy & Precautions, NPO status , Patient's Chart, lab work & pertinent test results, reviewed documented beta blocker date and time   History of Anesthesia Complications (+) PONV and history of anesthetic complications  Airway Mallampati: II  TM Distance: >3 FB     Dental no notable dental hx.    Pulmonary neg COPD, former smoker, neg PE   breath sounds clear to auscultation       Cardiovascular hypertension, Pt. on medications (-) CAD, (-) Past MI, (-) Cardiac Stents and (-) CABG (-) dysrhythmias  Rhythm:Regular Rate:Normal     Neuro/Psych neg Seizures    GI/Hepatic ,neg GERD  ,,(+) neg Cirrhosis        Endo/Other    Renal/GU Renal disease     Musculoskeletal   Abdominal   Peds  Hematology   Anesthesia Other Findings   Reproductive/Obstetrics                             Anesthesia Physical Anesthesia Plan  ASA: 2  Anesthesia Plan: General   Post-op Pain Management:    Induction: Intravenous  PONV Risk Score and Plan: 4 or greater and Ondansetron, Dexamethasone and Propofol infusion  Airway Management Planned: LMA  Additional Equipment:   Intra-op Plan:   Post-operative Plan:   Informed Consent: I have reviewed the patients History and Physical, chart, labs and discussed the procedure including the risks, benefits and alternatives for the proposed anesthesia with the patient or authorized representative who has indicated his/her understanding and acceptance.     Dental advisory given  Plan Discussed with: CRNA  Anesthesia Plan Comments:        Anesthesia Quick Evaluation

## 2024-02-29 NOTE — Transfer of Care (Signed)
 Immediate Anesthesia Transfer of Care Note  Patient: Kylie Cook  Procedure(s) Performed: LAPAROSCOPIC CHOLECYSTECTOMY (Abdomen) INDOCYANINE GREEN FLUORESCENCE IMAGING (ICG) (Abdomen)  Patient Location: PACU  Anesthesia Type:General  Level of Consciousness: awake, alert , and oriented  Airway & Oxygen Therapy: Patient Spontanous Breathing  Post-op Assessment: Report given to RN and Post -op Vital signs reviewed and stable  Post vital signs: Reviewed and stable  Last Vitals:  Vitals Value Taken Time  BP 138/97 02/29/24 1236  Temp    Pulse 70 02/29/24 1240  Resp 19 02/29/24 1240  SpO2 100 % 02/29/24 1240  Vitals shown include unfiled device data.  Last Pain:  Vitals:   02/29/24 0913  TempSrc:   PainSc: 0-No pain         Complications: No notable events documented.

## 2024-02-29 NOTE — Discharge Summary (Signed)
 Physician Discharge Summary  Kylie Cook ZOX:[id number] DOB: Nov 18, 1981 DOA: 02/26/2024  PCP: Sonny Masters, FNP  Admit date: 02/26/2024 Discharge date: 02/29/2024  Admitted From: Home Disposition: Home  Recommendations for Outpatient Follow-up:  Follow up with PCP in 1-2 weeks Please obtain CMP/CBC in one week Please follow up on the following pending results: Gallbladder pathology  Home Health: None Equipment/Devices: None  Discharge Condition: Stable CODE STATUS: Full code Diet recommendation: Regular diet, soft bland food  Discharge summary: 43 year old with history of hypertension, hyperlipidemia, obesity with previous history of chronic dyspepsia sent to ER by her PCP with epigastric abdominal pain nausea vomiting and bilirubin in her urine. In the emergency room potassium 2.8, AST and ALT 159/243 with total bili of 8.5. Treated with IV fluids. Admitted to the hospital with GI consultation.   # Choledocholithiasis, obstructive jaundice improving.  Likely passed bile duct stone: Treated with IV fluids and pain medications.  Underwent MRCP and did not show any evidence of obstructive bile duct stone.  Bilirubin normalizing.  AST ALT mildly elevated but downtrending.  Did not require any ERCP.  3/31, underwent lap chole.  Surgically stable as per surgery.  Home with postsurgical instructions.  Surgery to schedule outpatient follow-up.   Hypokalemia: Replaced.  Essential hypertension: Blood pressures stable.  Resume hydrochlorothiazide as potassium is replaced.  Hyperlipidemia: Patient on pravastatin.  AST is still 3 times normal.  Will suggest to hold for at least 1 week until repeat LFTs done at PCP office.  Going home today after surgical recovery.  Discharge Diagnoses:  Principal Problem:   Elevated liver enzymes Active Problems:   Hypokalemia   Asymptomatic bacteriuria   HTN (hypertension)   HLD (hyperlipidemia)   Jaundice    Discharge Instructions  Discharge  Instructions     Diet general   Complete by: As directed    Increase activity slowly   Complete by: As directed       Allergies as of 02/29/2024       Reactions   Sulfa Antibiotics Hives, Other (See Comments)        Medication List     PAUSE taking these medications    pravastatin 10 MG tablet Wait to take this until: March 07, 2024 Commonly known as: PRAVACHOL TAKE 1 TABLET BY MOUTH EVERY DAY       TAKE these medications    acetaminophen 500 MG tablet Commonly known as: TYLENOL Take 2 tablets (1,000 mg total) by mouth every 6 (six) hours.   hydrochlorothiazide 25 MG tablet Commonly known as: HYDRODIURIL Take 1 tablet (25 mg total) by mouth daily.   ibuprofen 200 MG tablet Commonly known as: ADVIL Take 3 tablets (600 mg total) by mouth every 8 (eight) hours as needed.   levonorgestrel-ethinyl estradiol 0.1-20 MG-MCG tablet Commonly known as: ALESSE Take 1 tablet by mouth daily.   MULTI-VITAMIN GUMMIES PO Take 2 tablets by mouth daily.   ondansetron 4 MG disintegrating tablet Commonly known as: ZOFRAN-ODT Take 1 tablet (4 mg total) by mouth every 8 (eight) hours as needed for nausea or vomiting.   oxyCODONE 5 MG immediate release tablet Commonly known as: Oxy IR/ROXICODONE Take 1 tablet (5 mg total) by mouth every 4 (four) hours as needed for moderate pain (pain score 4-6).   potassium chloride SA 20 MEQ tablet Commonly known as: KLOR-CON M Take 1 tablet (20 mEq total) by mouth 2 (two) times daily for 3 days.        Follow-up Information  Connect with your PCP/Specialist as discussed. Schedule an appointment as soon as possible for a visit .   Contact information: [email or web address] Call our physician referral line at [phone].        [place], [place], PA-C Follow up on 03/29/2024.   Specialty: General Surgery Why: 9:45am, Arrive 30 minutes prior to your appointment time, Please bring your insurance card and  photo ID Contact information: [address] [facility] [place] [address] [phone]                Allergies  Allergen Reactions   Sulfa Antibiotics Hives and Other (See Comments)    Consultations: Gastroenterology General Surgery   Procedures/Studies: MR ABDOMEN MRCP W WO CONTAST Result Date: 02/27/2024 CLINICAL DATA:  Jaundice. Biliary obstruction suspected. Gallstones on ultrasound. EXAM: MRI ABDOMEN WITHOUT AND WITH CONTRAST (INCLUDING MRCP) TECHNIQUE: Multiplanar multisequence MR imaging of the abdomen was performed both before and after the administration of intravenous contrast. Heavily T2-weighted images of the biliary and pancreatic ducts were obtained, and three-dimensional MRCP images were rendered by post processing. CONTRAST:  10mL GADAVIST GADOBUTROL 1 MMOL/ML IV SOLN COMPARISON:  Right upper quadrant abdominal ultrasound 02/26/2024. No other comparison studies. FINDINGS: Technical note: Despite efforts by the technologist and patient, mild motion artifact is present on today's exam and could not be eliminated. This reduces exam sensitivity and specificity. Lower chest:  The visualized lower chest appears unremarkable. Hepatobiliary: The liver has a non cirrhotic morphology. There is no significant steatosis, focal abnormality or abnormal enhancement. As seen on ultrasound, there are small gallstones and possible small polyps. The gallbladder is not significantly distended, but there is mild pericholecystic fluid. Sonographic Eulah Pont sign was reported as absent on earlier ultrasound. No evidence of biliary dilatation or choledocholithiasis. Pancreas: Unremarkable. No pancreatic ductal dilatation or surrounding inflammatory changes. Spleen: Normal in size without focal abnormality. Adrenals/Urinary Tract: Both adrenal glands appear normal. No evidence of renal mass, hydronephrosis or perinephric soft tissue stranding. Stomach/Bowel: The  stomach appears unremarkable for its degree of distension. No evidence of bowel wall thickening, distention or surrounding inflammatory change. Vascular/Lymphatic: There are no enlarged abdominal lymph nodes. No significant vascular findings. The portal, superior mesenteric and splenic veins are patent. Other: No ascites. Musculoskeletal: No acute or significant osseous findings. IMPRESSION: 1. Cholelithiasis with mild pericholecystic fluid. Sonographic Eulah Pont sign was reported as absent on recent ultrasound. Findings are equivocal for acute cholecystitis. Consider nuclear medicine HIDA scan for further evaluation. 2. No evidence of biliary dilatation or choledocholithiasis. The liver appears normal. 3. No other significant findings. Electronically Signed   By: Carey Bullocks M.D.   On: 02/27/2024 17:15   MR 3D Recon At Scanner Result Date: 02/27/2024 CLINICAL DATA:  Jaundice. Biliary obstruction suspected. Gallstones on ultrasound. EXAM: MRI ABDOMEN WITHOUT AND WITH CONTRAST (INCLUDING MRCP) TECHNIQUE: Multiplanar multisequence MR imaging of the abdomen was performed both before and after the administration of intravenous contrast. Heavily T2-weighted images of the biliary and pancreatic ducts were obtained, and three-dimensional MRCP images were rendered by post processing. CONTRAST:  10mL GADAVIST GADOBUTROL 1 MMOL/ML IV SOLN COMPARISON:  Right upper quadrant abdominal ultrasound 02/26/2024. No other comparison studies. FINDINGS: Technical note: Despite efforts by the technologist and patient, mild motion artifact is present on today's exam and could not be eliminated. This reduces exam sensitivity and specificity. Lower chest:  The visualized lower chest appears unremarkable. Hepatobiliary: The liver has a non cirrhotic morphology. There is no significant steatosis, focal abnormality or abnormal  enhancement. As seen on ultrasound, there are small gallstones and possible small polyps. The gallbladder is not  significantly distended, but there is mild pericholecystic fluid. Sonographic Eulah Pont sign was reported as absent on earlier ultrasound. No evidence of biliary dilatation or choledocholithiasis. Pancreas: Unremarkable. No pancreatic ductal dilatation or surrounding inflammatory changes. Spleen: Normal in size without focal abnormality. Adrenals/Urinary Tract: Both adrenal glands appear normal. No evidence of renal mass, hydronephrosis or perinephric soft tissue stranding. Stomach/Bowel: The stomach appears unremarkable for its degree of distension. No evidence of bowel wall thickening, distention or surrounding inflammatory change. Vascular/Lymphatic: There are no enlarged abdominal lymph nodes. No significant vascular findings. The portal, superior mesenteric and splenic veins are patent. Other: No ascites. Musculoskeletal: No acute or significant osseous findings. IMPRESSION: 1. Cholelithiasis with mild pericholecystic fluid. Sonographic Eulah Pont sign was reported as absent on recent ultrasound. Findings are equivocal for acute cholecystitis. Consider nuclear medicine HIDA scan for further evaluation. 2. No evidence of biliary dilatation or choledocholithiasis. The liver appears normal. 3. No other significant findings. Electronically Signed   By: Carey Bullocks M.D.   On: 02/27/2024 17:15   US ABDOMEN LIMITED RUQ (LIVER/GB) Result Date: 02/26/2024 CLINICAL DATA:  Jaundice. EXAM: ULTRASOUND ABDOMEN LIMITED RIGHT UPPER QUADRANT COMPARISON:  None Available. FINDINGS: Gallbladder: A 4.7 mm nonshadowing echogenic gallbladder polyp is seen along the nondependent wall of the gallbladder lumen. Shadowing echogenic gallstones are also seen (the largest measures 5.6 mm). The gallbladder wall measures 3.3 mm in thickness. No sonographic Murphy sign noted by sonographer. Common bile duct: Diameter: 7.6 mm Liver: No focal lesion identified. Diffusely increased echogenicity of the liver parenchyma is noted. Portal vein is  patent on color Doppler imaging with normal direction of blood flow towards the liver. Other: None. IMPRESSION: 1. Cholelithiasis. 2. 4.7 mm gallbladder polyp, likely benign. No additional follow-up is recommended. This recommendation follows ACR consensus guidelines: White Paper of the ACR Incidental Findings Committee II on Gallbladder and Biliary Findings. J Am Coll Radiol 2013:;10:953-956. 3. Hepatic steatosis. Electronically Signed   By: Aram Candela M.D.   On: 02/26/2024 21:59   (Echo, Carotid, EGD, Colonoscopy, ERCP)    Subjective: Patient seen in the morning rounds.  No symptoms.  Informed by surgical team that patient can go home with surgical instructions.   Discharge Exam: Vitals:   02/29/24 1315 02/29/24 1334  BP: (!) 145/86 (!) 146/83  Pulse: 72 74  Resp: 14 18  Temp: (!) 97.5 F (36.4 C) (!) 97.4 F (36.3 C)  SpO2: 100% 100%   Vitals:   02/29/24 1245 02/29/24 1300 02/29/24 1315 02/29/24 1334  BP: (!) 140/82 (!) 147/85 (!) 145/86 (!) 146/83  Pulse: 75 69 72 74  Resp: 16 15 14 18   Temp:   (!) 97.5 F (36.4 C) (!) 97.4 F (36.3 C)  TempSrc:    Oral  SpO2: 98% 100% 100% 100%  Weight:      Height:       Examination in the morning rounds. General: Pt is alert, awake, not in acute distress.  Walking around in the room. Cardiovascular: RRR, S1/S2 +, no rubs, no gallops Respiratory: CTA bilaterally, no wheezing, no rhonchi Abdominal: Soft, NT, ND, bowel sounds + Extremities: no edema, no cyanosis    The results of significant diagnostics from this hospitalization (including imaging, microbiology, ancillary and laboratory) are listed below for reference.     Microbiology: Recent Results (from the past 240 hours)  Microscopic Examination     Status: Abnormal  Collection Time: 02/26/24  2:51 PM   Urine  Result Value Ref Range Status   WBC, UA 0-5 0 - 5 /hpf Final   RBC, Urine None seen 0 - 2 /hpf Final   Epithelial Cells (non renal) 0-10 0 - 10 /hpf Final    Renal Epithel, UA None seen None seen /hpf Final   Bacteria, UA Few (A) None seen/Few Final   Yeast, UA None seen None seen Final  Urine Culture     Status: None   Collection Time: 02/26/24  3:37 PM   Specimen: Urine   UR  Result Value Ref Range Status   Urine Culture, Routine Final report  Final   Organism ID, Bacteria Comment  Final    Comment: Mixed urogenital flora 10,000-25,000 colony forming units per mL   Urine Culture (for pregnant, neutropenic or urologic patients or patients with an indwelling urinary catheter)     Status: Abnormal   Collection Time: 02/27/24  4:13 AM   Specimen: Urine, Clean Catch  Result Value Ref Range Status   Specimen Description URINE, CLEAN CATCH  Final   Special Requests   Final    NONE Performed at [facility], [address], [place], [place] [address]    Culture MULTIPLE SPECIES PRESENT, SUGGEST RECOLLECTION (A)  Final   Report Status 02/28/2024 FINAL  Final  Surgical pcr screen     Status: None   Collection Time: 02/28/24  9:09 PM   Specimen: Nasal Mucosa; Nasal Swab  Result Value Ref Range Status   MRSA, PCR NEGATIVE NEGATIVE Final   Staphylococcus aureus NEGATIVE NEGATIVE Final    Comment: (NOTE) The Xpert SA Assay (FDA approved for NASAL specimens in patients 69 years of age and older), is one component of a comprehensive surveillance program. It is not intended to diagnose infection nor to guide or monitor treatment. Performed at [facility], [address] [place], [place] [address]      Labs: BNP (last 3 results) No results for input(s): "BNP" in the last 8760 hours. Basic Metabolic Panel: Recent Labs  Lab 02/26/24 1749 02/26/24 2217 02/27/24 0902 02/28/24 0656 02/29/24 1400  NA 137  --  138 140 138  K 2.8*  --  3.0* 3.3* 3.5  CL 99  --  102 102 103  CO2 22  --  27 27 21*  GLUCOSE 125*  --  88 87 128*  BUN 7  --  <5* 6 10  CREATININE 0.87  --  0.66 0.73 0.67  CALCIUM 9.5  --  8.9 9.0 8.9  MG  --   2.4  --   --   --    Liver Function Tests: Recent Labs  Lab 02/26/24 1749 02/27/24 0902 02/28/24 0656 02/29/24 1400  AST 159* 122* 95* 84*  ALT 243* 204* 180* 156*  ALKPHOS 122 102 95 92  BILITOT 8.4*  8.5* 3.6* 1.8* 1.3*  PROT 8.2* 7.1 7.0 7.2  ALBUMIN 4.1 3.4* 3.2* 3.4*   Recent Labs  Lab 02/26/24 1749  LIPASE 27   No results for input(s): "AMMONIA" in the last 168 hours. CBC: Recent Labs  Lab 02/26/24 1749 02/28/24 0656  WBC 10.2 5.1  HGB 15.4* 13.2  HCT 45.3 38.1  MCV 88.3 86.8  PLT 410* 321   Cardiac Enzymes: No results for input(s): "CKTOTAL", "CKMB", "CKMBINDEX", "TROPONINI" in the last 168 hours. BNP: Invalid input(s): "POCBNP" CBG: No results for input(s): "GLUCAP" in the last 168 hours. D-Dimer  No results for input(s): "DDIMER" in the last 72 hours. Hgb A1c No results for input(s): "HGBA1C" in the last 72 hours. Lipid Profile No results for input(s): "CHOL", "HDL", "LDLCALC", "TRIG", "CHOLHDL", "LDLDIRECT" in the last 72 hours. Thyroid function studies No results for input(s): "TSH", "T4TOTAL", "T3FREE", "THYROIDAB" in the last 72 hours.  Invalid input(s): "FREET3" Anemia work up No results for input(s): "VITAMINB12", "FOLATE", "FERRITIN", "TIBC", "IRON", "RETICCTPCT" in the last 72 hours. Urinalysis    Component Value Date/Time   COLORURINE AMBER (A) 02/26/2024 1749   APPEARANCEUR CLOUDY (A) 02/26/2024 1749   APPEARANCEUR Clear 02/26/2024 1451   LABSPEC 1.021 02/26/2024 1749   PHURINE 5.0 02/26/2024 1749   GLUCOSEU NEGATIVE 02/26/2024 1749   HGBUR NEGATIVE 02/26/2024 1749   BILIRUBINUR MODERATE (A) 02/26/2024 1749   BILIRUBINUR Positive (A) 02/26/2024 1451   KETONESUR 20 (A) 02/26/2024 1749   PROTEINUR 30 (A) 02/26/2024 1749   PROTEINUR 1+ (A) 02/26/2024 1451   UROBILINOGEN negative 11/28/2013 1745   NITRITE NEGATIVE 02/26/2024 1749   NITRITE Negative 02/26/2024 1451   LEUKOCYTESUR SMALL (A) 02/26/2024 1749   LEUKOCYTESUR Trace (A)  02/26/2024 1451   Sepsis Labs Recent Labs  Lab 02/26/24 1749 02/28/24 0656  WBC 10.2 5.1   Microbiology Recent Results (from the past 240 hours)  Microscopic Examination     Status: Abnormal   Collection Time: 02/26/24  2:51 PM   Urine  Result Value Ref Range Status   WBC, UA 0-5 0 - 5 /hpf Final   RBC, Urine None seen 0 - 2 /hpf Final   Epithelial Cells (non renal) 0-10 0 - 10 /hpf Final   Renal Epithel, UA None seen None seen /hpf Final   Bacteria, UA Few (A) None seen/Few Final   Yeast, UA None seen None seen Final  Urine Culture     Status: None   Collection Time: 02/26/24  3:37 PM   Specimen: Urine   UR  Result Value Ref Range Status   Urine Culture, Routine Final report  Final   Organism ID, Bacteria Comment  Final    Comment: Mixed urogenital flora 10,000-25,000 colony forming units per mL   Urine Culture (for pregnant, neutropenic or urologic patients or patients with an indwelling urinary catheter)     Status: Abnormal   Collection Time: 02/27/24  4:13 AM   Specimen: Urine, Clean Catch  Result Value Ref Range Status   Specimen Description URINE, CLEAN CATCH  Final   Special Requests   Final    NONE Performed at [facility], [address], [place], [place] [address]    Culture MULTIPLE SPECIES PRESENT, SUGGEST RECOLLECTION (A)  Final   Report Status 02/28/2024 FINAL  Final  Surgical pcr screen     Status: None   Collection Time: 02/28/24  9:09 PM   Specimen: Nasal Mucosa; Nasal Swab  Result Value Ref Range Status   MRSA, PCR NEGATIVE NEGATIVE Final   Staphylococcus aureus NEGATIVE NEGATIVE Final    Comment: (NOTE) The Xpert SA Assay (FDA approved for NASAL specimens in patients 42 years of age and older), is one component of a comprehensive surveillance program. It is not intended to diagnose infection nor to guide or monitor treatment. Performed at [facility], [address], [place], [place] [address]      Time coordinating  discharge: 32 minutes  SIGNED:   Dorcas Carrow, MD  [facility] 02/29/2024, 3:51 PM

## 2024-02-29 NOTE — Progress Notes (Signed)
 DISCHARGE NOTE HOME Kylie Cook to be discharged Home per MD order. Discussed prescriptions and follow up appointments with the patient. Prescriptions given to patient; medication list explained in detail. Patient verbalized understanding.  Skin clean, dry and intact without evidence of skin break down, no evidence of skin tears noted. IV catheter discontinued intact. Site without signs and symptoms of complications. Dressing and pressure applied. Pt denies pain at the site currently. No complaints noted.  Patient free of lines, drains, and wounds.   An After Visit Summary (AVS) was printed and given to the patient. Patient escorted via wheelchair, and discharged home via private auto.  Margarita Grizzle, RN

## 2024-02-29 NOTE — Anesthesia Procedure Notes (Signed)
 Procedure Name: Intubation Date/Time: 02/29/2024 11:28 AM  Performed by: Sandie Ano, CRNAPre-anesthesia Checklist: Patient identified, Emergency Drugs available, Suction available and Patient being monitored Patient Re-evaluated:Patient Re-evaluated prior to induction Oxygen Delivery Method: Circle system utilized Preoxygenation: Pre-oxygenation with 100% oxygen Induction Type: IV induction Ventilation: Mask ventilation without difficulty Laryngoscope Size: Mac and 3 Grade View: Grade I Tube type: Oral Tube size: 7.0 mm Number of attempts: 1 Airway Equipment and Method: Stylet and Oral airway Placement Confirmation: ETT inserted through vocal cords under direct vision, positive ETCO2 and breath sounds checked- equal and bilateral Secured at: 22 cm Tube secured with: Tape Dental Injury: Teeth and Oropharynx as per pre-operative assessment

## 2024-02-29 NOTE — TOC Transition Note (Signed)
 Transition of Care [facility]) - Discharge Note   Patient Details  Name: Kylie Cook MRN: [id number] Date of Birth: Nov 17, 1981  [facility] of [facility] CM/SW Contact:  Tom-Johnson, Hershal Coria, RN Phone Number: 02/29/2024, 5:14 PM   Clinical Narrative:     Patient is scheduled for discharge today.  Readmission Risk Assessment done. Outpatient f/u, hospital f/u and discharge instructions on AVS. No TOC needs or recommendations noted. Husband, Samuel Bouche to transport at discharge.  No further TOC needs noted.       Final next level of care: Home/Self Care Barriers to Discharge: Barriers Resolved   Patient Goals and CMS Choice Patient states their goals for this hospitalization and ongoing recovery are:: To return home CMS Medicare.gov Compare Post Acute Care list provided to:: Patient Choice offered to / list presented to : NA      Discharge Placement                Patient to be transferred to facility by: Husband Name of family member notified: [facility]    Discharge Plan and Services Additional resources added to the After Visit Summary for                  DME Arranged: N/A DME Agency: NA       HH Arranged: NA HH Agency: NA        Social Drivers of Health (SDOH) Interventions SDOH Screenings   Food Insecurity: No Food Insecurity (02/17/2023)  Housing: Low Risk  (02/17/2023)  Transportation Needs: No Transportation Needs (02/17/2023)  Alcohol Screen: Low Risk  (02/17/2023)  Depression (PHQ2-9): Low Risk  (02/26/2024)  Financial Resource Strain: Medium Risk (02/17/2023)  Physical Activity: Insufficiently Active (11/26/2023)   Received from [facility]  Social Connections: Moderately Integrated (02/17/2023)  Stress: No Stress Concern Present (11/26/2023)   Received from [facility]  Tobacco Use: Medium Risk (02/29/2024)  Health Literacy: Low Risk  (11/26/2023)   Received from [facility]     Readmission Risk Interventions    02/29/2024     5:14 PM  Readmission Risk Prevention Plan  Post Dischage Appt Complete  Medication Screening Complete  Transportation Screening Complete

## 2024-02-29 NOTE — Op Note (Signed)
   Preoperative diagnosis: resolved choledocholithiasis, biliary colic Postoperative diagnosis: chronic cholecystitis Procedure: Laparoscopic cholecystectomy Surgeon: Dr. Harden Mo Asst: Barnetta Chapel PA-C Estimated blood loss: Less than 50 cc Specimens: Gallbladder and contents to pathology Complications: None Drains: None Anesthesia: General Sponge needle count correct at completion Disposition to recovery stable addition   Indications: 4 yof with gallstones and appears to have had choledocholithiasis which has resolved. We discussed proceeding with lap chole today.  Procedure: After informed consent was obtained she was taken to the OR.  She was given antibiotics preoperatively.  She had SCDs in place.  She was placed under general anesthesia without complication.  She was prepped and draped in a standard sterile surgical fashion.  Surgical timeout was then performed.   I infiltrated Marcaine below the umbilicus and made a vertical incision. I grasped the fascia and incised it. I entered the peritoneum bluntly. This was done without injury.  I then inserted a Hassan trocar after placing a 0 Vicryl pursestring suture.  The abdomen was insufflated 15 mmHg pressure.  I then inserted three additional 5 mm trocars under direct vision. She was noted to have chronic cholecystitis.  The gallbladder was retracted cephalad and lateral  I was able to dissect and get a critical view of safety.  I clearly saw both the artery and duct and there was a wide window behind that.  I also used the ICG dye to identify the common duct and cystic duct and this confirmed my anatomy.the cystic duct was larger than usual I think due to stone passage. I ended up taking the gallbladder completely off the liver so it was just attached by the cystic duct. I then confirmed again with ICG dye that the common duct was deep and that I was dividing the cystic duct. I placed three endoloops and divided the cystic duct.  The  gallbladder was then removed from the liver bed without difficulty.  This was placed in retrieval bag and removed from the umbilical incision. . Hemostasis was observed.   I then removed the [facility] trocar and tied my pursestring down.  I placed another 0 Vicryl suture with using the suture passer device to completely obliterate this defect.  I then removed the remaining trocars and desufflated the abdomen.  These were closed with 4-0 Monocryl and glue.  She tolerated this well was extubated and transferred recovery stable.

## 2024-03-01 ENCOUNTER — Encounter (HOSPITAL_COMMUNITY): Payer: Self-pay | Admitting: General Surgery

## 2024-03-01 ENCOUNTER — Telehealth: Payer: Self-pay

## 2024-03-01 LAB — SURGICAL PATHOLOGY

## 2024-03-01 NOTE — Transitions of Care (Post Inpatient/ED Visit) (Signed)
   03/01/2024  Name: Kylie Cook MRN: [id number] DOB: Apr 28, 1981  Today's TOC FU Call Status: Today's TOC FU Call Status:: Successful TOC FU Call Completed TOC FU Call Complete Date: 03/01/24 Patient's Name and Date of Birth confirmed.  Transition Care Management Follow-up Telephone Call Date of Discharge: 02/29/24 Discharge Facility: [facility]) Type of Discharge: Inpatient Admission Primary Inpatient Discharge Diagnosis:: jaundice How have you been since you were released from the hospital?: Better Any questions or concerns?: No  Items Reviewed: Did you receive and understand the discharge instructions provided?: Yes Medications obtained,verified, and reconciled?: Yes (Medications Reviewed) Any new allergies since your discharge?: No Dietary orders reviewed?: Yes Do you have support at home?: Yes People in Home: spouse  Medications Reviewed Today: Medications Reviewed Today     Reviewed by Karena Addison, LPN (Licensed Practical Nurse) on 03/01/24 at [phone]  Med List Status: <None>   Medication Order Taking? Sig Documenting Provider Last Dose Status Informant  acetaminophen (TYLENOL) 500 MG tablet [id number]  Take 2 tablets (1,000 mg total) by mouth every 6 (six) hours. Adam Phenix, PA-C  Active   hydrochlorothiazide (HYDRODIURIL) 25 MG tablet [id number] No Take 1 tablet (25 mg total) by mouth daily. Sonny Masters, [place] 02/26/2024 Morning Active Self  ibuprofen (ADVIL) 200 MG tablet [id number]  Take 3 tablets (600 mg total) by mouth every 8 (eight) hours as needed. Adam Phenix, PA-C  Active   levonorgestrel-ethinyl estradiol (ALESSE) 0.1-20 MG-MCG tablet [id number] No Take 1 tablet by mouth daily. [provider] 02/25/2024 Bedtime Active Self  Multiple Vitamins-Minerals (MULTI-VITAMIN GUMMIES PO) [id number] No Take 2 tablets by mouth daily. [provider] 02/26/2024 Active Self  ondansetron (ZOFRAN-ODT) 4 MG disintegrating tablet [id number]  Take 1  tablet (4 mg total) by mouth every 8 (eight) hours as needed for nausea or vomiting. Dorcas Carrow, MD  Active   oxyCODONE (OXY IR/ROXICODONE) 5 MG immediate release tablet [id number]  Take 1 tablet (5 mg total) by mouth every 4 (four) hours as needed for moderate pain (pain score 4-6). Adam Phenix, PA-C  Active   potassium chloride SA (KLOR-CON M) 20 MEQ tablet [id number]  Take 1 tablet (20 mEq total) by mouth 2 (two) times daily for 3 days. Dorcas Carrow, MD  Active   pravastatin (PRAVACHOL) 10 MG tablet [id number] No TAKE 1 TABLET BY MOUTH EVERY DAY Sonny Masters, FNP 02/26/2024 Active Self            Home Care and Equipment/Supplies: Were Home Health Services Ordered?: NA Any new equipment or medical supplies ordered?: NA  Functional Questionnaire: Do you need assistance with bathing/showering or dressing?: No Do you need assistance with meal preparation?: No Do you need assistance with eating?: No Do you have difficulty maintaining continence: No Do you need assistance with getting out of bed/getting out of a chair/moving?: No Do you have difficulty managing or taking your medications?: No  Follow up appointments reviewed: PCP Follow-up appointment confirmed?: No (declined appt) MD Provider Line Number:[phone] Given: No Specialist Hospital Follow-up appointment confirmed?: Yes Date of Specialist follow-up appointment?: 03/29/24 Follow-Up Specialty Provider:: surgeon Do you need transportation to your follow-up appointment?: No Do you understand care options if your condition(s) worsen?: Yes-patient verbalized understanding    SIGNATURE Karena Addison, LPN [facility] Nurse Health Advisor Direct Dial [phone]

## 2024-03-31 ENCOUNTER — Encounter: Payer: Self-pay | Admitting: Family Medicine

## 2024-03-31 ENCOUNTER — Ambulatory Visit: Payer: Self-pay | Admitting: Family Medicine

## 2024-03-31 VITALS — BP 130/77 | HR 56 | Temp 97.0°F | Ht 67.0 in | Wt 218.0 lb

## 2024-03-31 DIAGNOSIS — E782 Mixed hyperlipidemia: Secondary | ICD-10-CM

## 2024-03-31 DIAGNOSIS — Z9049 Acquired absence of other specified parts of digestive tract: Secondary | ICD-10-CM

## 2024-03-31 DIAGNOSIS — I10 Essential (primary) hypertension: Secondary | ICD-10-CM

## 2024-03-31 DIAGNOSIS — R748 Abnormal levels of other serum enzymes: Secondary | ICD-10-CM

## 2024-03-31 DIAGNOSIS — R7309 Other abnormal glucose: Secondary | ICD-10-CM

## 2024-03-31 LAB — BAYER DCA HB A1C WAIVED: HB A1C (BAYER DCA - WAIVED): 4.8 % (ref 4.8–5.6)

## 2024-03-31 LAB — LIPID PANEL

## 2024-03-31 MED ORDER — PRAVASTATIN SODIUM 10 MG PO TABS: 10.0000 mg | ORAL_TABLET | Freq: Every day | ORAL | 4 refills | Status: AC

## 2024-03-31 MED ORDER — HYDROCHLOROTHIAZIDE 25 MG PO TABS: 25.0000 mg | ORAL_TABLET | Freq: Every day | ORAL | 4 refills | Status: AC

## 2024-03-31 NOTE — Progress Notes (Signed)
 Subjective:  Patient ID: Kylie Cook, female    DOB: [birth year], 43 y.o.   MRN: [id number]  Patient Care Team: Galvin Jules, FNP as PCP - General (Family Medicine)   Chief Complaint:  Hyperlipidemia (6 month follow up )   HPI: Kylie Cook is a 43 y.o. female presenting on 03/31/2024 for Hyperlipidemia (6 month follow up )    History of Present Illness   Kylie Cook is a 43 year old female who presents for follow-up after gallbladder removal and management of hypertension and hyperlipidemia.   She was cleared to return to activity following her gallbladder removal. No major post-surgical complications such as constipation, nausea, or vomiting are present.  Her blood pressure has been stable, and she is currently taking hydrochlorothiazide . She also takes pravastatin  and birth control, and continues to take a multivitamin. Her heart rate is on the low normal side. No palpitations, dizziness, shortness of breath, or lightheadedness.  During her hospital stay, glucose levels were slightly elevated, and an A1c test is planned to assess for prediabetes. She was off her statin for eight days due to concerns about liver enzyme elevation, but no recent blood work was done to check liver enzymes.  Her sleep is disrupted due to having to get up multiple times at night to care for her elderly dog.        Relevant past medical, surgical, family, and social history reviewed and updated as indicated.  Allergies and medications reviewed and updated. Data reviewed: Chart in Epic.   Past Medical History:  Diagnosis Date   Closed left ankle fracture    Complication of anesthesia    Dermatographia    HLD (hyperlipidemia)    Hypertension    PONV (postoperative nausea and vomiting)     Past Surgical History:  Procedure Laterality Date   CERVICAL CONE BIOPSY     x2   CHOLECYSTECTOMY N/A 02/29/2024   Procedure: LAPAROSCOPIC CHOLECYSTECTOMY;  Surgeon: Enid Harry, MD;  Location:  [facility] OR;  Service: General;  Laterality: N/A;   ORIF ANKLE FRACTURE Left 03/22/2019   Procedure: Open treatment of left ankle trimalleolar fracture and syndesmosis injury with internal fixation;  Surgeon: Amada Backer, MD;  Location: [facility];  Service: Orthopedics;  Laterality: Left;    WISDOM TOOTH EXTRACTION      Social History   Socioeconomic History   Marital status: Married    Spouse name: Not on file   Number of children: Not on file   Years of education: Not on file   Highest education level: Bachelor's degree (e.g., BA, AB, BS)  Occupational History   Not on file  Tobacco Use   Smoking status: Former    Current packs/day: 0.00    Types: Cigarettes    Quit date: 12/01/2002    Years since quitting: 21.3   Smokeless tobacco: Never  Vaping Use   Vaping status: Never Used  Substance and Sexual Activity   Alcohol use: Yes    Comment: occ   Drug use: No   Sexual activity: Not on file  Other Topics Concern   Not on file  Social History Narrative   Not on file   Social Drivers of Health   Financial Resource Strain: Medium Risk (02/17/2023)   Overall Financial Resource Strain (CARDIA)    Difficulty of Paying Living Expenses: Somewhat hard  Food Insecurity: No Food Insecurity (02/17/2023)   Hunger Vital Sign    Worried About Running Out of Food  in the Last Year: Never true    Ran Out of Food in the Last Year: Never true  Transportation Needs: No Transportation Needs (02/17/2023)   PRAPARE - Administrator, Civil Service (Medical): No    Lack of Transportation (Non-Medical): No  Physical Activity: Insufficiently Active (11/26/2023)   Received from [facility]   Exercise Vital Sign    Days of Exercise per Week: 2 days    Minutes of Exercise per Session: 30 min  Stress: No Stress Concern Present (11/26/2023)   Received from [facility] - Occupational Stress Questionnaire    Feeling of  Stress : Not at all  Social Connections: Moderately Integrated (02/17/2023)   Social Connection and Isolation Panel [NHANES]    Frequency of Communication with Friends and Family: Once a week    Frequency of Social Gatherings with Friends and Family: Never    Attends Religious Services: More than 4 times per year    Active Member of [employer] or Organizations: Yes    Attends Engineer, structural: More than 4 times per year    Marital Status: Married  Catering manager Violence: Not At Risk (11/26/2023)   Received from [facility]   Humiliation, Afraid, Rape, and Kick questionnaire    Fear of Current or Ex-Partner: No    Emotionally Abused: No    Physically Abused: No    Sexually Abused: No    Outpatient Encounter Medications as of 03/31/2024  Medication Sig   levonorgestrel -ethinyl estradiol  (ALESSE) 0.1-20 MG-MCG tablet Take 1 tablet by mouth daily.   Multiple Vitamins-Minerals (MULTI-VITAMIN GUMMIES PO) Take 2 tablets by mouth daily.   [DISCONTINUED] hydrochlorothiazide  (HYDRODIURIL ) 25 MG tablet Take 1 tablet (25 mg total) by mouth daily.   [DISCONTINUED] pravastatin  (PRAVACHOL ) 10 MG tablet TAKE 1 TABLET BY MOUTH EVERY DAY   hydrochlorothiazide  (HYDRODIURIL ) 25 MG tablet Take 1 tablet (25 mg total) by mouth daily.   pravastatin  (PRAVACHOL ) 10 MG tablet Take 1 tablet (10 mg total) by mouth daily.   [DISCONTINUED] acetaminophen  (TYLENOL ) 500 MG tablet Take 2 tablets (1,000 mg total) by mouth every 6 (six) hours.   [DISCONTINUED] ibuprofen  (ADVIL ) 200 MG tablet Take 3 tablets (600 mg total) by mouth every 8 (eight) hours as needed.   [DISCONTINUED] ondansetron  (ZOFRAN -ODT) 4 MG disintegrating tablet Take 1 tablet (4 mg total) by mouth every 8 (eight) hours as needed for nausea or vomiting.   [DISCONTINUED] oxyCODONE  (OXY IR/ROXICODONE ) 5 MG immediate release tablet Take 1 tablet (5 mg total) by mouth every 4 (four) hours as needed for moderate pain (pain score 4-6).    [DISCONTINUED] potassium chloride  SA (KLOR-CON  M) 20 MEQ tablet Take 1 tablet (20 mEq total) by mouth 2 (two) times daily for 3 days.   No facility-administered encounter medications on file as of 03/31/2024.    Allergies  Allergen Reactions   Sulfa Antibiotics Hives and Other (See Comments)    Pertinent ROS per HPI, otherwise unremarkable      Objective:  BP 130/77   Pulse (!) 56   Temp (!) 97 F (36.1 C)   Ht 5\' 7"  (1.702 m)   Wt 218 lb (98.9 kg)   SpO2 99%   BMI 34.14 kg/m    Wt Readings from Last 3 Encounters:  03/31/24 218 lb (98.9 kg)  02/29/24 220 lb (99.8 kg)  02/26/24 219 lb 3.2 oz (99.4 kg)    Physical Exam Vitals and nursing  note reviewed.  Constitutional:      General: She is not in acute distress.    Appearance: Normal appearance. She is obese. She is not ill-appearing, toxic-appearing or diaphoretic.  HENT:     Head: Normocephalic and atraumatic.     Nose: Nose normal.     Mouth/Throat:     Mouth: Mucous membranes are moist.  Eyes:     Conjunctiva/sclera: Conjunctivae normal.     Pupils: Pupils are equal, round, and reactive to light.  Cardiovascular:     Rate and Rhythm: Normal rate and regular rhythm.     Heart sounds: Normal heart sounds.  Pulmonary:     Effort: Pulmonary effort is normal.     Breath sounds: Normal breath sounds.  Musculoskeletal:     Right lower leg: No edema.     Left lower leg: No edema.  Skin:    General: Skin is warm and dry.     Capillary Refill: Capillary refill takes less than 2 seconds.     Coloration: Skin is not jaundiced.  Neurological:     General: No focal deficit present.     Mental Status: She is alert and oriented to person, place, and time.  Psychiatric:        Mood and Affect: Mood normal.        Behavior: Behavior normal.        Thought Content: Thought content normal.        Judgment: Judgment normal.      Results for orders placed or performed during the hospital encounter of 02/26/24  Lipase,  blood   Collection Time: 02/26/24  5:49 PM  Result Value Ref Range   Lipase 27 11 - 51 U/L  Comprehensive metabolic panel   Collection Time: 02/26/24  5:49 PM  Result Value Ref Range   Sodium 137 135 - 145 mmol/L   Potassium 2.8 (L) 3.5 - 5.1 mmol/L   Chloride 99 98 - 111 mmol/L   CO2 22 22 - 32 mmol/L   Glucose, Bld 125 (H) 70 - 99 mg/dL   BUN 7 6 - 20 mg/dL   Creatinine, Ser 6.29 0.44 - 1.00 mg/dL   Calcium 9.5 8.9 - 52.8 mg/dL   Total Protein 8.2 (H) 6.5 - 8.1 g/dL   Albumin 4.1 3.5 - 5.0 g/dL   AST 413 (H) 15 - 41 U/L   ALT 243 (H) 0 - 44 U/L   Alkaline Phosphatase 122 38 - 126 U/L   Total Bilirubin 8.5 (H) 0.0 - 1.2 mg/dL   GFR, Estimated >24 >40 mL/min   Anion gap 16 (H) 5 - 15  CBC   Collection Time: 02/26/24  5:49 PM  Result Value Ref Range   WBC 10.2 4.0 - 10.5 K/uL   RBC 5.13 (H) 3.87 - 5.11 MIL/uL   Hemoglobin 15.4 (H) 12.0 - 15.0 g/dL   HCT 10.2 72.5 - 36.6 %   MCV 88.3 80.0 - 100.0 fL   MCH 30.0 26.0 - 34.0 pg   MCHC 34.0 30.0 - 36.0 g/dL   RDW 44.0 34.7 - 42.5 %   Platelets 410 (H) 150 - 400 K/uL   nRBC 0.0 0.0 - 0.2 %  Urinalysis, Routine w reflex microscopic -Urine, Clean Catch   Collection Time: 02/26/24  5:49 PM  Result Value Ref Range   Color, Urine AMBER (A) YELLOW   APPearance CLOUDY (A) CLEAR   Specific Gravity, Urine 1.021 1.005 - 1.030   pH 5.0 5.0 -  8.0   Glucose, UA NEGATIVE NEGATIVE mg/dL   Hgb urine dipstick NEGATIVE NEGATIVE   Bilirubin Urine MODERATE (A) NEGATIVE   Ketones, ur 20 (A) NEGATIVE mg/dL   Protein, ur 30 (A) NEGATIVE mg/dL   Nitrite NEGATIVE NEGATIVE   Leukocytes,Ua SMALL (A) NEGATIVE   RBC / HPF 6-10 0 - 5 RBC/hpf   WBC, UA >50 0 - 5 WBC/hpf   Bacteria, UA RARE (A) NONE SEEN   Squamous Epithelial / HPF >50 0 - 5 /HPF   Mucus PRESENT    Non Squamous Epithelial 0-5 (A) NONE SEEN  hCG, serum, qualitative   Collection Time: 02/26/24  5:49 PM  Result Value Ref Range   Preg, Serum NEGATIVE NEGATIVE  Bilirubin,  fractionated(tot/dir/indir)   Collection Time: 02/26/24  5:49 PM  Result Value Ref Range   Total Bilirubin 8.4 (H) 0.0 - 1.2 mg/dL   Bilirubin, Direct 5.2 (H) 0.0 - 0.2 mg/dL   Indirect Bilirubin 3.2 (H) 0.3 - 0.9 mg/dL  Hepatitis panel, acute   Collection Time: 02/26/24 10:17 PM  Result Value Ref Range   Hepatitis B Surface Ag NON REACTIVE NON REACTIVE   HCV Ab NON REACTIVE NON REACTIVE   Hep A IgM NON REACTIVE NON REACTIVE   Hep B C IgM NON REACTIVE NON REACTIVE  Mononucleosis screen   Collection Time: 02/26/24 10:17 PM  Result Value Ref Range   Mono Screen NEGATIVE NEGATIVE  Magnesium   Collection Time: 02/26/24 10:17 PM  Result Value Ref Range   Magnesium 2.4 1.7 - 2.4 mg/dL  Urine Culture (for pregnant, neutropenic or urologic patients or patients with an indwelling urinary catheter)   Collection Time: 02/27/24  4:13 AM   Specimen: Urine, Clean Catch  Result Value Ref Range   Specimen Description URINE, CLEAN CATCH    Special Requests      NONE Performed at [facility], [address], [place], [place] [address]    Culture MULTIPLE SPECIES PRESENT, SUGGEST RECOLLECTION (A)    Report Status 02/28/2024 FINAL   Comprehensive metabolic panel   Collection Time: 02/27/24  9:02 AM  Result Value Ref Range   Sodium 138 135 - 145 mmol/L   Potassium 3.0 (L) 3.5 - 5.1 mmol/L   Chloride 102 98 - 111 mmol/L   CO2 27 22 - 32 mmol/L   Glucose, Bld 88 70 - 99 mg/dL   BUN <5 (L) 6 - 20 mg/dL   Creatinine, Ser 1.91 0.44 - 1.00 mg/dL   Calcium 8.9 8.9 - 47.8 mg/dL   Total Protein 7.1 6.5 - 8.1 g/dL   Albumin 3.4 (L) 3.5 - 5.0 g/dL   AST 295 (H) 15 - 41 U/L   ALT 204 (H) 0 - 44 U/L   Alkaline Phosphatase 102 38 - 126 U/L   Total Bilirubin 3.6 (H) 0.0 - 1.2 mg/dL   GFR, Estimated >62 >13 mL/min   Anion gap 9 5 - 15  Acetaminophen  level   Collection Time: 02/27/24  9:02 AM  Result Value Ref Range   Acetaminophen  (Tylenol ), Serum <10 (L) 10 - 30 ug/mL  Comprehensive  metabolic panel with GFR   Collection Time: 02/28/24  6:56 AM  Result Value Ref Range   Sodium 140 135 - 145 mmol/L   Potassium 3.3 (L) 3.5 - 5.1 mmol/L   Chloride 102 98 - 111 mmol/L   CO2 27 22 - 32 mmol/L   Glucose, Bld 87 70 - 99 mg/dL   BUN 6 6 -  20 mg/dL   Creatinine, Ser 4.09 0.44 - 1.00 mg/dL   Calcium 9.0 8.9 - 81.1 mg/dL   Total Protein 7.0 6.5 - 8.1 g/dL   Albumin 3.2 (L) 3.5 - 5.0 g/dL   AST 95 (H) 15 - 41 U/L   ALT 180 (H) 0 - 44 U/L   Alkaline Phosphatase 95 38 - 126 U/L   Total Bilirubin 1.8 (H) 0.0 - 1.2 mg/dL   GFR, Estimated >91 >47 mL/min   Anion gap 11 5 - 15  CBC   Collection Time: 02/28/24  6:56 AM  Result Value Ref Range   WBC 5.1 4.0 - 10.5 K/uL   RBC 4.39 3.87 - 5.11 MIL/uL   Hemoglobin 13.2 12.0 - 15.0 g/dL   HCT 82.9 56.2 - 13.0 %   MCV 86.8 80.0 - 100.0 fL   MCH 30.1 26.0 - 34.0 pg   MCHC 34.6 30.0 - 36.0 g/dL   RDW 86.5 78.4 - 69.6 %   Platelets 321 150 - 400 K/uL   nRBC 0.0 0.0 - 0.2 %  Surgical pcr screen   Collection Time: 02/28/24  9:09 PM   Specimen: Nasal Mucosa; Nasal Swab  Result Value Ref Range   MRSA, PCR NEGATIVE NEGATIVE   Staphylococcus aureus NEGATIVE NEGATIVE  Surgical pathology   Collection Time: 02/29/24 12:01 PM  Result Value Ref Range   SURGICAL PATHOLOGY      SURGICAL PATHOLOGY CASE: [id number] PATIENT: Lennie Hickey Surgical Pathology Report     Clinical History: cholecystitis (cm)     FINAL MICROSCOPIC DIAGNOSIS:  A. 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    GROSS DESCRIPTION:  Size/?Intact: Received fresh is an 8.0 x 3.0 x 1.8 cm minimally disrupted gallbladder. Serosal surface: Pink-tan and smooth to roughened Mucosa/Wall: Green and trabeculated, without distinct lesions, and with a wall thickness of 0.2 cm. Contents: Blood-tinged, green viscous bile, and multiple yellow-green, round, minimally roughened calculi ranging from  0.1 to 0.5 cm. Cystic duct: Minimally obstructed by the previously mentioned calculi Block Summary: Representative wall and the cystic duct margin are submitted in 1 block. Margurette Shillings, 02/29/2024)   Final Diagnosis performed by Mark LeGolvan DO.   Electronically signed 03/01/2024 Technical component performed at [employer]. [facility], [address] [place], [place] [address].  Professional component performed at [facility], [address] [place], [place] [address].  Immunohistochemistry Technical component (if applicable) was performed at [facility]. [address], [address], [place], [place] [address].   IMMUNOHISTOCHEMISTRY DISCLAIMER (if applicable): Some of these immunohistochemical stains may have been developed and the performance characteristics determine by [facility]. Some may not have been cleared or approved by the U.S. Food and Drug Administration. The FDA has determined that such clearance or approval is not necessary. This test is used for clinical purposes. It should not be regarded as investigational or for research. This laboratory is certified under the Clinical Laboratory Improvement Amendments of 1988 (CLIA-88) as qualified to perform high complexity clinical laboratory testing.  The controls stained  appropriately.   IHC stains are performed on formalin fixed, paraffin embedded tissue using a 3,3"diaminobenzidine (DAB) chromogen and Leica Bond Autostainer System. The staining intensity of the nucleus is score manually and is reported as the percentage of tumor cell nuclei demonstrating specific nuclear staining. The specimens are fixed in 10% Neutral Formalin for at least 6 hours and up to 72hrs. These tests are validated on decalcified tissue.  Results should be interpreted with caution given the possibility of false negative results on decalcified specimens. Antibody Clones are as follows ER-clone 4F,  PR-clone 16, Ki67- clone MM1. Some of these immunohistochemical stains may have been developed and the performance characteristics determined by [facility].   Comprehensive metabolic panel   Collection Time: 02/29/24  2:00 PM  Result Value Ref Range   Sodium 138 135 - 145 mmol/L   Potassium 3.5 3.5 - 5.1 mmol/L   Chloride 103 98 - 111 mmol/L   CO2 21 (L) 22 - 32 mmol/L   Glucose, Bld 128 (H) 70 - 99 mg/dL   BUN 10 6 - 20 mg/dL   Creatinine, Ser 1.61 0.44 - 1.00 mg/dL   Calcium 8.9 8.9 - 09.6 mg/dL   Total Protein 7.2 6.5 - 8.1 g/dL   Albumin 3.4 (L) 3.5 - 5.0 g/dL   AST 84 (H) 15 - 41 U/L   ALT 156 (H) 0 - 44 U/L   Alkaline Phosphatase 92 38 - 126 U/L   Total Bilirubin 1.3 (H) 0.0 - 1.2 mg/dL   GFR, Estimated >04 >54 mL/min   Anion gap 14 5 - 15       Pertinent labs & imaging results that were available during my care of the patient were reviewed by me and considered in my medical decision making.  Assessment & Plan:  Eponine was seen today for hyperlipidemia.  Diagnoses and all orders for this visit:  Mixed hyperlipidemia -     Lipid panel -     CMP14+EGFR -     pravastatin  (PRAVACHOL ) 10 MG tablet; Take 1 tablet (10 mg total) by mouth daily.  Elevated liver enzymes -     CMP14+EGFR -     CBC with Differential/Platelet -     CMP14+EGFR  History of cholecystectomy -     CMP14+EGFR -     CBC with Differential/Platelet -     Lipid panel -     CMP14+EGFR -     CBC with Differential/Platelet  Primary hypertension -     Lipid panel -     CMP14+EGFR -     Thyroid  Panel With TSH -     CBC with Differential/Platelet -     hydrochlorothiazide  (HYDRODIURIL ) 25 MG tablet; Take 1 tablet (25 mg total) by mouth daily.  Elevated glucose -     Bayer DCA Hb A1c Waived -     CMP14+EGFR      Wellness Visit Routine wellness visit with no new issues. Blood pressure is well-controlled. Heart rate is low normal but asymptomatic. No palpitations, dizziness, shortness  of breath, or lightheadedness. Vision is good. Sleep disturbances due to caring for an elderly dog. - Update all medications and prescribe a year's supply. - Schedule annual physical in one year if labs are normal. - If labs are abnormal, notify her and consider earlier follow-up.  Hypertension Hypertension is well-controlled with hydrochlorothiazide .  Hyperlipidemia Hyperlipidemia managed with pravastatin . Previous lipid panel: total cholesterol 259 mg/dL, LDL 098 mg/dL. Current pravastatin  dose is low and not causing muscle aches. Plan to check cholesterol levels today. Discussed potential dose increase if cholesterol remains high, as there is room for adjustment without causing muscle aches. - Check cholesterol levels. - Consider increasing pravastatin  dose if cholesterol levels remain high.  Post-cholecystectomy status Post-cholecystectomy status with no complications such as constipation, nausea, or vomiting. Incisions are healing well. Advised to avoid weightlifting for two more weeks. Liver enzymes were not checked  post-surgery; plan to check today. Discussed that statins can elevate liver enzymes, which is why they were paused for eight days. - Check liver enzymes. - Advise her to avoid weightlifting for two more weeks.  Follow-up Follow-up plan for lab results and potential medication adjustments. - Notify her of lab results by tomorrow or Monday. - Adjust pravastatin  dose if necessary based on lab results.          Continue all other maintenance medications.  Follow up plan: Return in about 6 months (around 10/01/2024), or if symptoms worsen or fail to improve.   Continue healthy lifestyle choices, including diet (rich in fruits, vegetables, and lean proteins, and low in salt and simple carbohydrates) and exercise (at least 30 minutes of moderate physical activity daily).  Educational handout given for hyperlipidemia   The above assessment and management plan was  discussed with the patient. The patient verbalized understanding of and has agreed to the management plan. Patient is aware to call the clinic if they develop any new symptoms or if symptoms persist or worsen. Patient is aware when to return to the clinic for a follow-up visit. Patient educated on when it is appropriate to go to the emergency department.   Kattie Parrot, FNP-C [facility] [phone]

## 2024-04-01 LAB — THYROID PANEL WITH TSH
Free Thyroxine Index: 1.9 (ref 1.2–4.9)
T3 Uptake Ratio: 18 % — ABNORMAL LOW (ref 24–39)
T4, Total: 10.5 ug/dL (ref 4.5–12.0)
TSH: 1.82 u[IU]/mL (ref 0.450–4.500)

## 2024-04-01 LAB — CMP14+EGFR
ALT: 13 IU/L (ref 0–32)
AST: 17 IU/L (ref 0–40)
Albumin: 4.3 g/dL (ref 3.9–4.9)
Alkaline Phosphatase: 59 IU/L (ref 44–121)
BUN/Creatinine Ratio: 14 (ref 9–23)
BUN: 10 mg/dL (ref 6–24)
Bilirubin Total: 0.5 mg/dL (ref 0.0–1.2)
CO2: 24 mmol/L (ref 20–29)
Calcium: 9.5 mg/dL (ref 8.7–10.2)
Chloride: 99 mmol/L (ref 96–106)
Creatinine, Ser: 0.72 mg/dL (ref 0.57–1.00)
Globulin, Total: 2.9 g/dL (ref 1.5–4.5)
Glucose: 77 mg/dL (ref 70–99)
Potassium: 3.8 mmol/L (ref 3.5–5.2)
Sodium: 140 mmol/L (ref 134–144)
Total Protein: 7.2 g/dL (ref 6.0–8.5)
eGFR: 108 mL/min/{1.73_m2} (ref >59)

## 2024-04-01 LAB — CBC WITH DIFFERENTIAL/PLATELET
Basophils Absolute: 0 10*3/uL (ref 0.0–0.2)
Basos: 1 %
EOS (ABSOLUTE): 0.3 10*3/uL (ref 0.0–0.4)
Eos: 5 %
Hematocrit: 42.1 % (ref 34.0–46.6)
Hemoglobin: 14.3 g/dL (ref 11.1–15.9)
Immature Grans (Abs): 0 10*3/uL (ref 0.0–0.1)
Immature Granulocytes: 0 %
Lymphocytes Absolute: 3 10*3/uL (ref 0.7–3.1)
Lymphs: 48 %
MCH: 30.8 pg (ref 26.6–33.0)
MCHC: 34 g/dL (ref 31.5–35.7)
MCV: 91 fL (ref 79–97)
Monocytes Absolute: 0.3 10*3/uL (ref 0.1–0.9)
Monocytes: 6 %
Neutrophils Absolute: 2.5 10*3/uL (ref 1.4–7.0)
Neutrophils: 40 %
Platelets: 320 10*3/uL (ref 150–450)
RBC: 4.65 x10E6/uL (ref 3.77–5.28)
RDW: 13.1 % (ref 11.7–15.4)
WBC: 6.1 10*3/uL (ref 3.4–10.8)

## 2024-04-01 LAB — LIPID PANEL
Chol/HDL Ratio: 5.5 ratio — ABNORMAL HIGH (ref 0.0–4.4)
Cholesterol, Total: 199 mg/dL (ref 100–199)
HDL: 36 mg/dL — ABNORMAL LOW (ref >39)
LDL Chol Calc (NIH): 125 mg/dL — ABNORMAL HIGH (ref 0–99)
Triglycerides: 211 mg/dL — ABNORMAL HIGH (ref 0–149)
VLDL Cholesterol Cal: 38 mg/dL (ref 5–40)

## 2024-10-21 ENCOUNTER — Ambulatory Visit (INDEPENDENT_AMBULATORY_CARE_PROVIDER_SITE_OTHER): Payer: BC Managed Care – PPO | Admitting: Family Medicine

## 2024-10-21 ENCOUNTER — Encounter: Payer: Self-pay | Admitting: Family Medicine

## 2024-10-21 VITALS — BP 134/87 | HR 72 | Temp 97.4°F | Ht 67.0 in | Wt 220.8 lb

## 2024-10-21 DIAGNOSIS — Z0000 Encounter for general adult medical examination without abnormal findings: Secondary | ICD-10-CM

## 2024-10-21 DIAGNOSIS — Z0184 Encounter for antibody response examination: Secondary | ICD-10-CM

## 2024-10-21 DIAGNOSIS — Z0001 Encounter for general adult medical examination with abnormal findings: Secondary | ICD-10-CM | POA: Diagnosis not present

## 2024-10-21 DIAGNOSIS — G8929 Other chronic pain: Secondary | ICD-10-CM | POA: Diagnosis not present

## 2024-10-21 DIAGNOSIS — E782 Mixed hyperlipidemia: Secondary | ICD-10-CM | POA: Diagnosis not present

## 2024-10-21 DIAGNOSIS — I10 Essential (primary) hypertension: Secondary | ICD-10-CM | POA: Diagnosis not present

## 2024-10-21 DIAGNOSIS — M25511 Pain in right shoulder: Secondary | ICD-10-CM | POA: Diagnosis not present

## 2024-10-21 LAB — LIPID PANEL

## 2024-10-21 MED ORDER — NAPROXEN 500 MG PO TABS: 500.0000 mg | ORAL_TABLET | Freq: Two times a day (BID) | ORAL | 0 refills | Status: AC

## 2024-10-21 NOTE — Progress Notes (Signed)
 Complete physical exam  Patient: Kylie Cook   DOB: 01/21/1981   43 y.o. Female  MRN: [id number]  Subjective:    Chief Complaint  Patient presents with   Annual Exam   Shoulder Pain    Right since June after sleeping on firm matress     Kylie Cook is a 43 y.o. female who presents today for a complete physical exam. She reports consuming a general diet. The patient does not participate in regular exercise at present. She generally feels well. She reports sleeping well. She does have additional problems to discuss today.   She experiences right shoulder pain that began after sleeping on a firm mattress. The pain is described as an ache, particularly noticeable when rotating the shoulder or stretching the arm. She has attempted to alleviate the pain by using a sling and performing stretches, but these measures have not provided relief. Initially, she took ibuprofen  for the pain but did not continue its use for long. No loss of function, weakness, or burning sensation in the shoulder.  Her sleep is often interrupted by her dog, who has dementia, causing her to wake up multiple times a night. Despite this, she reports sleeping well when not disturbed by the dog. In terms of physical activity, she mentions a decrease in exercise as her dog, who was her walking companion, is no longer able to walk as much. She stays at home with her son, who is in preschool for part of the day, which limits her time for personal activities.  Her diet consists mostly of home-cooked meals, though she admits it may not be as balanced as it should be. She primarily drinks water and some hot tea at night, acknowledging that her water intake could be better. She has no significant changes in hearing or vision, though she notes better far-sighted vision compared to near-sighted vision. No abnormal bleeding, bruising, or swelling in her legs or feet. Her menstrual cycle is regular due to birth control use.      Most  recent fall risk assessment:    10/21/2024   10:01 AM  Fall Risk   Falls in the past year? 1  Number falls in past yr: 0  Injury with Fall? 0  Risk for fall due to : History of fall(s)  Follow up Falls evaluation completed     Most recent depression screenings:    10/21/2024   10:03 AM 03/31/2024    9:46 AM  PHQ 2/9 Scores  PHQ - 2 Score 0 0  PHQ- 9 Score 1 3      Data saved with a previous flowsheet row definition    Vision:Within last year and Dental: No current dental problems and Receives regular dental care  Patient Active Problem List   Diagnosis Date Noted   History of cholecystectomy 03/31/2024   Hypokalemia 02/27/2024   Elevated liver enzymes 02/26/2024   HLD (hyperlipidemia) 10/17/2022   HTN (hypertension) 03/25/2022   Body mass index (BMI) 35.0-35.9, adult 03/25/2022   History of cervical cone biopsy affecting care of mother, antepartum, third trimester 06/17/2021   Plantar fasciitis of left foot 04/09/2020   Past Medical History:  Diagnosis Date   Closed left ankle fracture    Complication of anesthesia    Dermatographia    HLD (hyperlipidemia)    Hypertension    PONV (postoperative nausea and vomiting)    Past Surgical History:  Procedure Laterality Date   CERVICAL CONE BIOPSY     x2  CHOLECYSTECTOMY N/A 02/29/2024   Procedure: LAPAROSCOPIC CHOLECYSTECTOMY;  Surgeon: Ebbie Cough, MD;  Location: [facility] OR;  Service: General;  Laterality: N/A;   ORIF ANKLE FRACTURE Left 03/22/2019   Procedure: Open treatment of left ankle trimalleolar fracture and syndesmosis injury with internal fixation;  Surgeon: Kit Rush, MD;  Location: [facility];  Service: Orthopedics;  Laterality: Left;    WISDOM TOOTH EXTRACTION     Social History   Tobacco Use   Smoking status: Former    Current packs/day: 0.00    Types: Cigarettes    Quit date: 12/01/2002    Years since quitting: 21.9   Smokeless tobacco: Never  Vaping Use   Vaping status:  Never Used  Substance Use Topics   Alcohol use: Yes    Comment: occ   Drug use: No   Social History   Socioeconomic History   Marital status: Married    Spouse name: Not on file   Number of children: Not on file   Years of education: Not on file   Highest education level: Bachelor's degree (e.g., BA, AB, BS)  Occupational History   Not on file  Tobacco Use   Smoking status: Former    Current packs/day: 0.00    Types: Cigarettes    Quit date: 12/01/2002    Years since quitting: 21.9   Smokeless tobacco: Never  Vaping Use   Vaping status: Never Used  Substance and Sexual Activity   Alcohol use: Yes    Comment: occ   Drug use: No   Sexual activity: Not on file  Other Topics Concern   Not on file  Social History Narrative   Not on file   Social Drivers of Health   Financial Resource Strain: Low Risk  (10/20/2024)   Overall Financial Resource Strain (CARDIA)    Difficulty of Paying Living Expenses: Not very hard  Food Insecurity: No Food Insecurity (10/20/2024)   Hunger Vital Sign    Worried About Running Out of Food in the Last Year: Never true    Ran Out of Food in the Last Year: Never true  Transportation Needs: No Transportation Needs (10/20/2024)   PRAPARE - Administrator, Civil Service (Medical): No    Lack of Transportation (Non-Medical): No  Physical Activity: Insufficiently Active (10/20/2024)   Exercise Vital Sign    Days of Exercise per Week: 1 day    Minutes of Exercise per Session: 40 min  Stress: No Stress Concern Present (10/20/2024)   [employer] of Occupational Health - Occupational Stress Questionnaire    Feeling of Stress: Only a little  Social Connections: Unknown (10/20/2024)   Social Connection and Isolation Panel    Frequency of Communication with Friends and Family: Once a week    Frequency of Social Gatherings with Friends and Family: Not on file    Attends Religious Services: More than 4 times per year    Active Member  of [employer] or Organizations: Yes    Attends Engineer, Structural: More than 4 times per year    Marital Status: Married  Catering Manager Violence: Not At Risk (11/26/2023)   Received from [facility]   Humiliation, Afraid, Rape, and Kick questionnaire    Within the last year, have you been afraid of your partner or ex-partner?: No    Within the last year, have you been humiliated or emotionally abused in other ways by your partner or ex-partner?: No    Within the last  year, have you been kicked, hit, slapped, or otherwise physically hurt by your partner or ex-partner?: No    Within the last year, have you been raped or forced to have any kind of sexual activity by your partner or ex-partner?: No   Family Status  Relation Name Status   Mother  Alive   Father  Alive   Sister  Alive   Son  Alive   Mat Aunt  (Not Specified)   Nurse, Mental Health  (Not Specified)   Bruna Nyhan  (Not Specified)   MGM  Deceased   MGF  Deceased   PGM  Deceased   PGF  Deceased  No partnership data on file   Family History  Problem Relation Age of Onset   Hypertension Mother    Diabetes Mother    Hypertension Father    Hypertension Sister    Bipolar disorder Sister    Cancer Maternal Aunt    Diabetes Maternal Aunt    Diabetes Maternal Uncle    Hypertension Maternal Uncle    Parkinson's disease Maternal Uncle    Lymphoma Maternal Uncle    Thyroid  disease Paternal Aunt    Osteoporosis Maternal Grandfather    Cancer Paternal Grandmother    Dementia Paternal Grandmother    Hypertension Paternal Grandfather    Allergies  Allergen Reactions   Sulfa Antibiotics Hives and Other (See Comments)      Patient Care Team: Severa Rock HERO, FNP as PCP - General (Family Medicine)   Outpatient Medications Prior to Visit  Medication Sig   hydrochlorothiazide  (HYDRODIURIL ) 25 MG tablet Take 1 tablet (25 mg total) by mouth daily.   levonorgestrel -ethinyl estradiol  (ALESSE) 0.1-20 MG-MCG tablet Take 1 tablet  by mouth daily.   Multiple Vitamins-Minerals (MULTI-VITAMIN GUMMIES PO) Take 2 tablets by mouth daily.   pravastatin  (PRAVACHOL ) 10 MG tablet Take 1 tablet (10 mg total) by mouth daily.   No facility-administered medications prior to visit.    ROS per HPI       Objective:     BP 134/87   Pulse 72   Temp (!) 97.4 F (36.3 C)   Ht 5' 7 (1.702 m)   Wt 220 lb 12.8 oz (100.2 kg)   LMP 10/17/2024   SpO2 100%   BMI 34.58 kg/m  BP Readings from Last 3 Encounters:  10/21/24 134/87  03/31/24 130/77  02/29/24 137/81   Wt Readings from Last 3 Encounters:  10/21/24 220 lb 12.8 oz (100.2 kg)  03/31/24 218 lb (98.9 kg)  02/29/24 220 lb (99.8 kg)   SpO2 Readings from Last 3 Encounters:  10/21/24 100%  03/31/24 99%  02/29/24 100%      Physical Exam Vitals and nursing note reviewed.  Constitutional:      General: She is not in acute distress.    Appearance: She is obese. She is not ill-appearing, toxic-appearing or diaphoretic.  HENT:     Head: Normocephalic and atraumatic.     Right Ear: Tympanic membrane, ear canal and external ear normal.     Left Ear: Tympanic membrane, ear canal and external ear normal.     Nose: Nose normal.     Mouth/Throat:     Mouth: Mucous membranes are moist.     Pharynx: Oropharynx is clear. No oropharyngeal exudate or posterior oropharyngeal erythema.     Comments: Cobblestoning  Eyes:     Extraocular Movements: Extraocular movements intact.     Conjunctiva/sclera: Conjunctivae normal.     Pupils: Pupils are equal,  round, and reactive to light.  Neck:     Thyroid : No thyroid  mass, thyromegaly or thyroid  tenderness.  Cardiovascular:     Rate and Rhythm: Normal rate and regular rhythm.     Heart sounds: Normal heart sounds.  Pulmonary:     Effort: Pulmonary effort is normal.     Breath sounds: Normal breath sounds.  Abdominal:     General: Bowel sounds are normal.     Palpations: Abdomen is soft.     Tenderness: There is no abdominal  tenderness.     Hernia: No hernia is present.  Musculoskeletal:     Right shoulder: Tenderness present. No swelling, deformity, effusion, laceration, bony tenderness or crepitus. Normal range of motion. Normal strength. Normal pulse.     Right upper arm: Normal.     Cervical back: Normal, normal range of motion and neck supple.     Right lower leg: No edema.     Left lower leg: No edema.  Lymphadenopathy:     Cervical: No cervical adenopathy.  Skin:    General: Skin is warm and dry.     Capillary Refill: Capillary refill takes less than 2 seconds.  Neurological:     General: No focal deficit present.     Mental Status: She is alert and oriented to person, place, and time.  Psychiatric:        Mood and Affect: Mood normal.        Behavior: Behavior normal. Behavior is cooperative.        Thought Content: Thought content normal.        Judgment: Judgment normal.      Last CBC Lab Results  Component Value Date   WBC 6.1 03/31/2024   HGB 14.3 03/31/2024   HCT 42.1 03/31/2024   MCV 91 03/31/2024   MCH 30.8 03/31/2024   RDW 13.1 03/31/2024   PLT 320 03/31/2024   Last metabolic panel Lab Results  Component Value Date   GLUCOSE 77 03/31/2024   NA 140 03/31/2024   K 3.8 03/31/2024   CL 99 03/31/2024   CO2 24 03/31/2024   BUN 10 03/31/2024   CREATININE 0.72 03/31/2024   EGFR 108 03/31/2024   CALCIUM 9.5 03/31/2024   PROT 7.2 03/31/2024   ALBUMIN 4.3 03/31/2024   LABGLOB 2.9 03/31/2024   AGRATIO 1.6 10/17/2022   BILITOT 0.5 03/31/2024   ALKPHOS 59 03/31/2024   AST 17 03/31/2024   ALT 13 03/31/2024   ANIONGAP 14 02/29/2024   Last lipids Lab Results  Component Value Date   CHOL 199 03/31/2024   HDL 36 (L) 03/31/2024   LDLCALC 125 (H) 03/31/2024   TRIG 211 (H) 03/31/2024   CHOLHDL 5.5 (H) 03/31/2024   Last hemoglobin A1c Lab Results  Component Value Date   HGBA1C 4.8 03/31/2024   Last thyroid  functions Lab Results  Component Value Date   TSH 1.820  03/31/2024   T4TOTAL 10.5 03/31/2024   Last vitamin D  Lab Results  Component Value Date   VD25OH 45.6 10/20/2023   Last vitamin B12 and Folate Lab Results  Component Value Date   VITAMINB12 409 10/20/2023   FOLATE >20.0 10/20/2023        Assessment & Plan:    Routine Health Maintenance and Physical Exam  Immunization History  Administered Date(s) Administered   Influenza Inj Mdck Quad Pf 08/28/2021   Influenza,inj,Quad PF,6+ Mos 09/30/2023   Influenza,inj,Quad PF,6-35 Mos 09/12/2022   Influenza,inj,quad, With Preservative 09/04/2018   Influenza-Unspecified 09/27/2024  Moderna Covid-19 Fall Seasonal Vaccine 63yrs & older 09/30/2023   Moderna SARS-COV2 Booster Vaccination 09/05/2021, 09/12/2022   Moderna Sars-Covid-2 Vaccination 02/20/2020, 03/20/2020, 10/06/2020   Tdap 09/27/2013, 10/16/2021   Unspecified SARS-COV-2 Vaccination 09/27/2024    Health Maintenance  Topic Date Due   Hepatitis B Vaccines 19-59 Average Risk (1 of 3 - 19+ 3-dose series) Never done   HPV VACCINES (1 - 3-dose SCDM series) 10/21/2025 (Originally 08/16/2009)   Mammogram  01/10/2026   Cervical Cancer Screening (HPV/Pap Cotest)  11/25/2026   DTaP/Tdap/Td (3 - Td or Tdap) 10/17/2031   Influenza Vaccine  Completed   COVID-19 Vaccine  Completed   Hepatitis C Screening  Completed   HIV Screening  Completed   Pneumococcal Vaccine  Aged Out   Meningococcal B Vaccine  Aged Out    Discussed health benefits of physical activity, and encouraged her to engage in regular exercise appropriate for her age and condition.  Problem List Items Addressed This Visit       Cardiovascular and Mediastinum   HTN (hypertension)   Relevant Orders   Lipid panel   CBC with Differential/Platelet   CMP14+EGFR   TSH   T4, free     Other   HLD (hyperlipidemia)   Relevant Orders   Lipid panel   CMP14+EGFR   TSH   T4, free   Other Visit Diagnoses       Annual physical exam    -  Primary   Relevant Orders    Lipid panel   CBC with Differential/Platelet   CMP14+EGFR   Hepatitis B surface antibody,qualitative     Immunity status testing       Relevant Orders   Hepatitis B surface antibody,qualitative     Chronic right shoulder pain       Relevant Medications   naproxen  (NAPROSYN ) 500 MG tablet      Adult Wellness Visit Routine adult wellness visit with no significant changes in hearing or vision. Regular dental visits. Menstrual cycle is regular due to birth control. No abnormal bleeding, bruising, leg swelling, or headaches. Bowel and bladder habits are normal. No significant changes in diet or exercise routine. Adequate water intake. Sleep is generally good, though occasionally interrupted by dog. No family history of glaucoma. No current allergy symptoms. - Ordered blood work including liver function studies, cholesterol, thyroid , and hepatitis B titer - Ensure mammogram is scheduled for early next year - Encouraged regular eye exams for glaucoma screening  Right shoulder impingement syndrome Likely due to prolonged pressure on shoulder while sleeping. Symptoms include aching pain with shoulder movement, no loss of function or weakness. No significant improvement with ibuprofen . No contraindications with current medications, including birth control. Naproxen  is metabolized through the kidneys, and kidney function is excellent. - Prescribed naproxen  twice a day with food for two weeks - Advised to report if no significant improvement after two weeks for potential x-ray  Mixed hyperlipidemia No recent cholesterol checks. Importance of monitoring cholesterol levels discussed. - Ordered cholesterol panel as part of blood work  Essential hypertension Well controlled         Return in about 1 year (around 10/21/2025) for Annual Physical.     Rosaline Bruns, FNP

## 2024-10-22 LAB — CBC WITH DIFFERENTIAL/PLATELET
Basophils Absolute: 0.1 x10E3/uL (ref 0.0–0.2)
Basos: 1 %
EOS (ABSOLUTE): 0.3 x10E3/uL (ref 0.0–0.4)
Eos: 4 %
Hematocrit: 44.1 % (ref 34.0–46.6)
Hemoglobin: 14.9 g/dL (ref 11.1–15.9)
Immature Grans (Abs): 0 x10E3/uL (ref 0.0–0.1)
Immature Granulocytes: 0 %
Lymphocytes Absolute: 3.3 x10E3/uL — ABNORMAL HIGH (ref 0.7–3.1)
Lymphs: 55 %
MCH: 30.7 pg (ref 26.6–33.0)
MCHC: 33.8 g/dL (ref 31.5–35.7)
MCV: 91 fL (ref 79–97)
Monocytes Absolute: 0.4 x10E3/uL (ref 0.1–0.9)
Monocytes: 7 %
Neutrophils Absolute: 2 x10E3/uL (ref 1.4–7.0)
Neutrophils: 33 %
Platelets: 370 x10E3/uL (ref 150–450)
RBC: 4.86 x10E6/uL (ref 3.77–5.28)
RDW: 12.1 % (ref 11.7–15.4)
WBC: 6 x10E3/uL (ref 3.4–10.8)

## 2024-10-22 LAB — CMP14+EGFR
ALT: 23 IU/L (ref 0–32)
AST: 20 IU/L (ref 0–40)
Albumin: 4.3 g/dL (ref 3.9–4.9)
Alkaline Phosphatase: 62 IU/L (ref 41–116)
BUN/Creatinine Ratio: 13 (ref 9–23)
BUN: 10 mg/dL (ref 6–24)
Bilirubin Total: 0.5 mg/dL (ref 0.0–1.2)
CO2: 24 mmol/L (ref 20–29)
Calcium: 10 mg/dL (ref 8.7–10.2)
Chloride: 99 mmol/L (ref 96–106)
Creatinine, Ser: 0.78 mg/dL (ref 0.57–1.00)
Globulin, Total: 2.8 g/dL (ref 1.5–4.5)
Glucose: 80 mg/dL (ref 70–99)
Potassium: 3.7 mmol/L (ref 3.5–5.2)
Sodium: 138 mmol/L (ref 134–144)
Total Protein: 7.1 g/dL (ref 6.0–8.5)
eGFR: 97 mL/min/1.73 (ref >59)

## 2024-10-22 LAB — LIPID PANEL
Cholesterol, Total: 235 mg/dL — AB (ref 100–199)
HDL: 44 mg/dL (ref >39)
LDL CALC COMMENT:: 5.3 ratio — AB (ref 0.0–4.4)
LDL Chol Calc (NIH): 164 mg/dL — AB (ref 0–99)
Triglycerides: 147 mg/dL (ref 0–149)
VLDL Cholesterol Cal: 27 mg/dL (ref 5–40)

## 2024-10-22 LAB — HEPATITIS B SURFACE ANTIBODY,QUALITATIVE: Hep B Surface Ab, Qual: NONREACTIVE

## 2024-10-22 LAB — TSH: TSH: 1.34 u[IU]/mL (ref 0.450–4.500)

## 2024-10-22 LAB — T4, FREE: Free T4: 1.19 ng/dL (ref 0.82–1.77)

## 2024-10-23 ENCOUNTER — Ambulatory Visit: Payer: Self-pay | Admitting: Family Medicine

## 2025-10-31 ENCOUNTER — Encounter: Admitting: Family Medicine
